# Patient Record
Sex: Male | Born: 1995 | Race: White | Hispanic: No | Marital: Single | Smoking: Never smoker
Health system: Southern US, Community
[De-identification: ages and names within clinical notes are randomized; demographics above are authoritative.]

## PROBLEM LIST (undated history)

## (undated) DIAGNOSIS — T7840XA Allergy, unspecified, initial encounter: Secondary | ICD-10-CM

## (undated) DIAGNOSIS — M1990 Unspecified osteoarthritis, unspecified site: Secondary | ICD-10-CM

## (undated) HISTORY — DX: Allergy, unspecified, initial encounter: T78.40XA

## (undated) HISTORY — DX: Unspecified osteoarthritis, unspecified site: M19.90

## (undated) HISTORY — PX: APPENDECTOMY: SHX54

---
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
## 2014-09-21 ENCOUNTER — Ambulatory Visit (INDEPENDENT_AMBULATORY_CARE_PROVIDER_SITE_OTHER): Payer: BC Managed Care – PPO | Admitting: Family Medicine

## 2014-09-21 ENCOUNTER — Encounter: Payer: Self-pay | Admitting: Family Medicine

## 2014-09-21 VITALS — BP 100/70 | HR 53 | Temp 98.5°F | Resp 16 | Ht 74.0 in | Wt 171.2 lb

## 2014-09-21 DIAGNOSIS — Z0184 Encounter for antibody response examination: Secondary | ICD-10-CM

## 2014-09-21 DIAGNOSIS — Z0000 Encounter for general adult medical examination without abnormal findings: Secondary | ICD-10-CM

## 2014-09-21 DIAGNOSIS — J302 Other seasonal allergic rhinitis: Secondary | ICD-10-CM | POA: Diagnosis not present

## 2014-09-21 DIAGNOSIS — Z23 Encounter for immunization: Secondary | ICD-10-CM | POA: Diagnosis not present

## 2014-09-21 LAB — COMPLETE METABOLIC PANEL WITH GFR
ALK PHOS: 66 U/L (ref 39–117)
ALT: 16 U/L (ref 0–53)
AST: 22 U/L (ref 0–37)
Albumin: 4 g/dL (ref 3.5–5.2)
BILIRUBIN TOTAL: 0.6 mg/dL (ref 0.2–1.2)
BUN: 11 mg/dL (ref 6–23)
CO2: 28 mEq/L (ref 19–32)
Calcium: 9.1 mg/dL (ref 8.4–10.5)
Chloride: 106 mEq/L (ref 96–112)
Creat: 0.98 mg/dL (ref 0.50–1.35)
GFR, Est African American: >89 mL/min
GLUCOSE: 85 mg/dL (ref 70–99)
Potassium: 4.3 mEq/L (ref 3.5–5.3)
Sodium: 140 mEq/L (ref 135–145)
TOTAL PROTEIN: 6.2 g/dL (ref 6.0–8.3)

## 2014-09-21 LAB — CBC WITH DIFFERENTIAL/PLATELET
Basophils Absolute: 0 10*3/uL (ref 0.0–0.1)
Basophils Relative: 0 % (ref 0–1)
EOS ABS: 0.1 10*3/uL (ref 0.0–0.7)
EOS PCT: 1 % (ref 0–5)
HCT: 43.4 % (ref 39.0–52.0)
Hemoglobin: 14.8 g/dL (ref 13.0–17.0)
LYMPHS ABS: 1.6 10*3/uL (ref 0.7–4.0)
Lymphocytes Relative: 26 % (ref 12–46)
MCH: 30.8 pg (ref 26.0–34.0)
MCHC: 34.1 g/dL (ref 30.0–36.0)
MCV: 90.2 fL (ref 78.0–100.0)
MPV: 10.7 fL (ref 8.6–12.4)
Monocytes Absolute: 0.5 10*3/uL (ref 0.1–1.0)
Monocytes Relative: 9 % (ref 3–12)
Neutro Abs: 3.8 10*3/uL (ref 1.7–7.7)
Neutrophils Relative %: 64 % (ref 43–77)
PLATELETS: 252 10*3/uL (ref 150–400)
RBC: 4.81 MIL/uL (ref 4.22–5.81)
RDW: 12.9 % (ref 11.5–15.5)
WBC: 6 10*3/uL (ref 4.0–10.5)

## 2014-09-21 LAB — POCT URINALYSIS DIPSTICK
Bilirubin, UA: NEGATIVE
Glucose, UA: NEGATIVE
Ketones, UA: NEGATIVE
Leukocytes, UA: NEGATIVE
NITRITE UA: NEGATIVE
PROTEIN UA: NEGATIVE
RBC UA: NEGATIVE
SPEC GRAV UA: 1.01
UROBILINOGEN UA: 0.2
pH, UA: 6.5

## 2014-09-21 MED ORDER — FLUTICASONE PROPIONATE 50 MCG/ACT NA SUSP: NASAL | Status: DC

## 2014-09-21 NOTE — Patient Instructions (Addendum)
Keeping you healthy  Get these tests  Blood pressure- Have your blood pressure checked once a year by your healthcare provider.  Normal blood pressure is 120/80.  Weight- Have your body mass index (BMI) calculated to screen for obesity.  BMI is a measure of body fat based on height and weight. You can also calculate your own BMI at https://www.west-esparza.com/www.nhlbisupport.com/bmi/.  Cholesterol- Have your cholesterol checked regularly starting at age 20, sooner may be necessary if you have diabetes, high blood pressure, if a family member developed heart diseases at an early age or if you smoke.   Chlamydia, HIV, and other sexual transmitted disease- Get screened each year until the age of 20 then within three months of each new sexual partner.  Diabetes- Have your blood sugar checked regularly if you have high blood pressure, high cholesterol, a family history of diabetes or if you are overweight.  Get these vaccines- All your immunizations are current.  Flu shot- Every fall.  Tetanus shot- Every 10 years.  Menactra- Single dose; prevents meningitis.  Take these steps  Don't smoke- If you do smoke, ask your healthcare provider about quitting. For tips on how to quit, go to www.smokefree.gov or call 1-800-QUIT-NOW.  Be physically active- Exercise 5 days a week for at least 30 minutes.  If you are not already physically active start slow and gradually work up to 30 minutes of moderate physical activity.  Examples of moderate activity include walking briskly, mowing the yard, dancing, swimming bicycling, etc.  Eat a healthy diet- Eat a variety of healthy foods such as fruits, vegetables, low fat milk, low fat cheese, yogurt, lean meats, poultry, fish, beans, tofu, etc.  For more information on healthy eating, go to www.thenutritionsource.org  Drink alcohol in moderation- Limit alcohol intake two drinks or less a day.  Never drink and drive.  Dentist- Brush and floss teeth twice daily; visit your dentis twice a  year.  Depression-Your emotional health is as important as your physical health.  If you're feeling down, losing interest in things you normally enjoy please talk with your healthcare provider.  Gun Safety- If you keep a gun in your home, keep it unloaded and with the safety lock on.  Bullets should be stored separately.  Helmet use- Always wear a helmet when riding a motorcycle, bicycle, rollerblading or skateboarding.  Safe sex- If you may be exposed to a sexually transmitted infection, use a condom  Seat belts- Seat bels can save your life; always wear one.  Smoke/Carbon Monoxide detectors- These detectors need to be installed on the appropriate level of your home.  Replace batteries at least once a year.  Skin Cancer- When out in the sun, cover up and use sunscreen SPF 15 or higher.  Violence- If anyone is threatening or hurting you, please tell your healthcare provider.   The results of labs will be available in about 5 days. You must return to have your PPD checked in 48-72 hours.

## 2014-09-21 NOTE — Progress Notes (Signed)
Subjective:    Patient ID: Yoshito Gaza, male    DOB: 04/25/1996, 19 y.o.   MRN: [id number]  HPI  This 19 y.o. Male is here for CPE and to have form completed related to employment. 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 He is in [employer], after active [employer] duty.  HCM: IMM- Current.   Review of Systems  Constitutional: Negative.   HENT: Negative.   Eyes: Negative.   Respiratory: Negative.   Cardiovascular: Negative.   Gastrointestinal: Negative.   Endocrine: Negative.   Genitourinary: Negative.        Not sexually active- low risk for STDs per pt.  Musculoskeletal: Negative.   Skin: Negative.   Neurological: Negative.   Hematological: Negative.   Psychiatric/Behavioral: Negative.       Objective:   Physical Exam  Constitutional: He is oriented to person, place, and time. Vital signs are normal. He appears well-developed and well-nourished. No distress.  Blood pressure 100/70, pulse 53, temperature 98.5 F (36.9 C), temperature source Oral, resp. rate 16, height  (1.88 m), weight 171 lb 3.2 oz (77.656 kg), SpO2 98 %.   HENT:  Head: Normocephalic and atraumatic.  Right Ear: Hearing, tympanic membrane, external ear and ear canal normal.  Left Ear: Hearing, tympanic membrane, external ear and ear canal normal.  Nose: Nose normal. No nasal deformity or septal deviation.  Mouth/Throat: Uvula is midline, oropharynx is clear and moist and mucous membranes are normal. No oral lesions. Normal dentition. No dental caries.  Eyes: Conjunctivae, EOM and lids are normal. Pupils are equal, round, and reactive to light. No scleral icterus.  Fundoscopic exam:      The right eye shows no arteriolar narrowing, no AV nicking and no papilledema. The right eye shows red reflex.       The left eye shows no arteriolar narrowing, no AV nicking and no papilledema. The left eye shows red reflex.  Neck: Trachea normal, normal range of motion and full passive range of motion  without pain. Neck supple. No spinous process tenderness and no muscular tenderness present. No thyroid mass and no thyromegaly present.  Cardiovascular: Normal rate, regular rhythm, S1 normal, S2 normal, normal heart sounds, intact distal pulses and normal pulses.   No extrasystoles are present. PMI is not displaced.  Exam reveals no gallop and no friction rub.   No murmur heard. Pulmonary/Chest: Effort normal and breath sounds normal. No respiratory distress.  Abdominal: Soft. Normal appearance and bowel sounds are normal. He exhibits no distension and no mass. There is no hepatosplenomegaly. There is no tenderness. There is no guarding and no CVA tenderness.  Genitourinary:  Deferred.  Musculoskeletal:       Right shoulder: Normal.       Right elbow: Normal.      Left elbow: Normal.       Right knee: Normal.       Left knee: Normal.       Cervical back: Normal.       Thoracic back: Normal.       Lumbar back: Normal.  Remainder of exam unremarkable.  Lymphadenopathy:       Head (right side): No submental, no submandibular, no tonsillar, no preauricular, no posterior auricular and no occipital adenopathy present.       Head (left side): No submental, no submandibular, no tonsillar, no preauricular, no posterior auricular and no occipital adenopathy present.    He has no cervical adenopathy.  Right: No inguinal and no supraclavicular adenopathy present.       Left: No inguinal and no supraclavicular adenopathy present.  Neurological: He is alert and oriented to person, place, and time. He has normal strength and normal reflexes. No cranial nerve deficit or sensory deficit. He displays a negative Romberg sign. Coordination and gait normal.  Skin: Skin is warm, dry and intact. No ecchymosis, no lesion and no rash noted. He is not diaphoretic. No cyanosis or erythema. No pallor. Nails show no clubbing.  Psychiatric: He has a normal mood and affect. His speech is normal. Judgment and  thought content normal. Cognition and memory are normal.  Pt visibly nervous initially but relaxed by end of encounter.  Nursing note and vitals reviewed.   Results for orders placed or performed in visit on 09/21/14  POCT urinalysis dipstick  Result Value Ref Range   Color, UA yellow    Clarity, UA clear    Glucose, UA neg    Bilirubin, UA neg    Ketones, UA neg    Spec Grav, UA 1.010    Blood, UA neg    pH, UA 6.5    Protein, UA neg    Urobilinogen, UA 0.2    Nitrite, UA neg    Leukocytes, UA Negative        Assessment & Plan:  Routine general medical examination at a health care facility - Plan: POCT urinalysis dipstick, CBC with Differential/Platelet, COMPLETE METABOLIC PANEL WITH GFR  Other seasonal allergic rhinitis - Plan: fluticasone (FLONASE) 50 MCG/ACT nasal spray  Immunity status testing - Plan: Measles/Mumps/Rubella Immunity, Varicella zoster antibody, IgG  Need for tuberculosis vaccination - Plan: TB Skin Test

## 2014-09-22 LAB — MEASLES/MUMPS/RUBELLA IMMUNITY
Mumps IgG: 77.6 AU/mL — ABNORMAL HIGH (ref <9.00)
RUBELLA: 14.2 {index} — AB (ref <0.90)
RUBEOLA IGG: 79.6 [AU]/ml — AB (ref <25.00)

## 2014-09-22 LAB — VARICELLA ZOSTER ANTIBODY, IGG: VARICELLA IGG: 109.6 {index} (ref <135.00)

## 2014-09-23 ENCOUNTER — Ambulatory Visit (INDEPENDENT_AMBULATORY_CARE_PROVIDER_SITE_OTHER): Payer: BC Managed Care – PPO | Admitting: *Deleted

## 2014-09-23 ENCOUNTER — Encounter: Payer: Self-pay | Admitting: *Deleted

## 2014-09-23 DIAGNOSIS — Z111 Encounter for screening for respiratory tuberculosis: Secondary | ICD-10-CM

## 2014-09-23 LAB — TB SKIN TEST
Induration: 0 mm
TB Skin Test: NEGATIVE

## 2014-09-23 NOTE — Progress Notes (Signed)
Quick Note:  Please advise pt regarding following labs... Complete blood counts (CBC) and metabolic panel (sodium, potassium, calcium, blood sugar, kidney and liver function) are normal.  Recent labs show that you have had MMR vaccine and have immunity. The Varicella titer (chicken pox) is low and indicates no immunity. You need to have 2 doses of the Varicella vaccine. This vaccine may be available at the local health department.  Copy of results to pt. ______

## 2014-09-24 ENCOUNTER — Encounter: Payer: Self-pay | Admitting: Family Medicine

## 2014-09-24 NOTE — Progress Notes (Signed)
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
## 2015-07-18 ENCOUNTER — Ambulatory Visit (INDEPENDENT_AMBULATORY_CARE_PROVIDER_SITE_OTHER): Payer: BC Managed Care – PPO | Admitting: Emergency Medicine

## 2015-07-18 VITALS — BP 108/72 | HR 67 | Temp 98.2°F | Resp 16 | Ht 73.5 in | Wt 165.2 lb

## 2015-07-18 DIAGNOSIS — J0140 Acute pansinusitis, unspecified: Secondary | ICD-10-CM | POA: Diagnosis not present

## 2015-07-18 DIAGNOSIS — J209 Acute bronchitis, unspecified: Secondary | ICD-10-CM | POA: Diagnosis not present

## 2015-07-18 MED ORDER — AMOXICILLIN-POT CLAVULANATE 875-125 MG PO TABS: 1.0000 | ORAL_TABLET | Freq: Two times a day (BID) | ORAL | Status: DC

## 2015-07-18 MED ORDER — PSEUDOEPHEDRINE-GUAIFENESIN ER 60-600 MG PO TB12: 1.0000 | ORAL_TABLET | Freq: Two times a day (BID) | ORAL | Status: DC

## 2015-07-18 NOTE — Progress Notes (Deleted)
[facility] [address], [place] [address] [phone]  Date:  07/18/2015   Name:  Donald ReaperJoshua Forbes   DOB:  1995-09-08   MRN:  [id number]  PCP:  No PCP Per Patient    Chief Complaint: Sinusitis and Nasal Congestion   History of Present Illness:  This is a 20 y.o. male with PMH allergic rhinitis who is presenting with nasal congestion and postnasal drip since waking this morning.  Cough: *** SOB/wheezing: *** Nasal congestion: *** Otalgia: *** Sore throat: *** Fever/chills: *** Aggravating/alleviating factors: *** History of asthma: *** History of env allergies: *** Tobacco use: ***   Review of Systems:  Review of Systems  Patient Active Problem List   Diagnosis Date Noted  . Other seasonal allergic rhinitis 09/21/2014    Prior to Admission medications   Medication Sig Start Date End Date Taking? Authorizing Provider  fluticasone (FLONASE) 50 MCG/ACT nasal spray 2 sprays into each nostril daily for allergies 09/21/14  Yes Maurice MarchBarbara B McPherson, MD    Allergies  Allergen Reactions  . Benadryl [Diphenhydramine]   . Codeine Other (See Comments)    Suspect GI intolerance but exact problem not known.    Past Surgical History  Procedure Laterality Date  . Appendectomy      Social History  Substance Use Topics  . Smoking status: Never Smoker   . Smokeless tobacco: None  . Alcohol Use: No    Family History  Problem Relation Age of Onset  . Arthritis Mother   . Amenorrhea Maternal Aunt   . Arthritis Maternal Grandmother   . Stroke Maternal Grandmother   . Arthritis Maternal Grandfather   . Stroke Maternal Grandfather   . Hyperlipidemia Maternal Grandfather   . Arthritis Paternal Grandmother   . Arthritis Paternal Grandfather   . Diabetes Paternal Grandfather   . AAA (abdominal aortic aneurysm) Neg Hx     Medication list has been reviewed and updated.  Physical Examination:  Physical Exam  BP 108/72 mmHg  Pulse 67  Temp(Src)  98.2 F (36.8 C) (Oral)  Resp 16  Ht 6' 1.5" (1.867 m)  Wt 165 lb 3.2 oz (74.934 kg)  BMI 21.50 kg/m2  SpO2 98%  Assessment and Plan:

## 2015-07-18 NOTE — Patient Instructions (Signed)

## 2015-07-18 NOTE — Progress Notes (Signed)
Subjective:  Patient ID: Donald Forbes, male    DOB: 10-31-1995  Age: 20 y.o. MRN: [id number]  CC: Sinusitis and Nasal Congestion   HPI Coady Train presents  patient has nasal congestion nasal discharge and postnasal drainage is purulent character she has a cough no wheezing or shortness of breath. He has no nausea vomiting or stool change. No rash. Been unable to control her symptoms with over-the-counter medication. He has a history of seasonal allergic rhinitis  History Emilio has a past medical history of Allergy and Arthritis.   He has past surgical history that includes Appendectomy.   His  family history includes Amenorrhea in his maternal aunt; Arthritis in his maternal grandfather, maternal grandmother, mother, paternal grandfather, and paternal grandmother; Diabetes in his paternal grandfather; Hyperlipidemia in his maternal grandfather; Stroke in his maternal grandfather and maternal grandmother. There is no history of AAA (abdominal aortic aneurysm).  He   reports that he has never smoked. He does not have any smokeless tobacco history on file. He reports that he does not drink alcohol or use illicit drugs.  Outpatient Prescriptions Prior to Visit  Medication Sig Dispense Refill  . fluticasone (FLONASE) 50 MCG/ACT nasal spray 2 sprays into each nostril daily for allergies 16 g 6   No facility-administered medications prior to visit.    Social History   Social History  . Marital Status: Single    Spouse Name: N/A  . Number of Children: N/A  . Years of Education: N/A   Occupational History  . [employer]    Social History Main Topics  . Smoking status: Never Smoker   . Smokeless tobacco: None  . Alcohol Use: No  . Drug Use: No  . Sexual Activity: No   Other Topics Concern  . None   Social History Narrative   Education: senior in high school. Single. Exercise: Yes.   Tobacco: none     Review of Systems  Constitutional: Positive for  fatigue. Negative for fever, chills and appetite change.  HENT: Positive for congestion, rhinorrhea and sinus pressure. Negative for ear pain, postnasal drip and sore throat.   Eyes: Negative for pain and redness.  Respiratory: Positive for cough. Negative for shortness of breath and wheezing.   Cardiovascular: Negative for leg swelling.  Gastrointestinal: Negative for nausea, vomiting, abdominal pain, diarrhea, constipation and blood in stool.  Endocrine: Negative for polyuria.  Genitourinary: Negative for dysuria, urgency, frequency and flank pain.  Musculoskeletal: Negative for gait problem.  Skin: Negative for rash.  Neurological: Negative for weakness and headaches.  Psychiatric/Behavioral: Negative for confusion and decreased concentration. The patient is not nervous/anxious.     Objective:  BP 108/72 mmHg  Pulse 67  Temp(Src) 98.2 F (36.8 C) (Oral)  Resp 16  Ht 6' 1.5" (1.867 m)  Wt 165 lb 3.2 oz (74.934 kg)  BMI 21.50 kg/m2  SpO2 98%  Physical Exam  Constitutional: He is oriented to person, place, and time. He appears well-developed and well-nourished. No distress.  HENT:  Head: Normocephalic and atraumatic.  Right Ear: External ear normal.  Left Ear: External ear normal.  Nose: Nose normal.  Eyes: Conjunctivae and EOM are normal. Pupils are equal, round, and reactive to light. No scleral icterus.  Neck: Normal range of motion. Neck supple. No tracheal deviation present.  Cardiovascular: Normal rate, regular rhythm and normal heart sounds.   Pulmonary/Chest: Effort normal. No respiratory distress. He has no wheezes. He has no rales.  Abdominal: He exhibits no  mass. There is no tenderness. There is no rebound and no guarding.  Musculoskeletal: He exhibits no edema.  Lymphadenopathy:    He has no cervical adenopathy.  Neurological: He is alert and oriented to person, place, and time. Coordination normal.  Skin: Skin is warm and dry. No rash noted.  Psychiatric: He has  a normal mood and affect. His behavior is normal.      Assessment & Plan:   [name] was seen today for sinusitis and nasal congestion.  Diagnoses and all orders for this visit:  Acute bronchitis, unspecified organism  Acute pansinusitis, recurrence not specified  Other orders -     amoxicillin-clavulanate (AUGMENTIN) 875-125 MG tablet; Take 1 tablet by mouth 2 (two) times daily. -     pseudoephedrine-guaifenesin (MUCINEX D) 60-600 MG 12 hr tablet; Take 1 tablet by mouth every 12 (twelve) hours.  I am having Mr. [name] start on amoxicillin-clavulanate and pseudoephedrine-guaifenesin. I am also having him maintain his fluticasone.  Meds ordered this encounter  Medications  . amoxicillin-clavulanate (AUGMENTIN) 875-125 MG tablet    Sig: Take 1 tablet by mouth 2 (two) times daily.    Dispense:  20 tablet    Refill:  0  . pseudoephedrine-guaifenesin (MUCINEX D) 60-600 MG 12 hr tablet    Sig: Take 1 tablet by mouth every 12 (twelve) hours.    Dispense:  18 tablet    Refill:  0    Appropriate red flag conditions were discussed with the patient as well as actions that should be taken.  Patient expressed his understanding.  Follow-up: Return if symptoms worsen or fail to improve.  Carmelina DaneAnderson, Jeffery S, MD

## 2016-01-25 ENCOUNTER — Ambulatory Visit (INDEPENDENT_AMBULATORY_CARE_PROVIDER_SITE_OTHER): Payer: BC Managed Care – PPO | Admitting: Urgent Care

## 2016-01-25 VITALS — BP 118/74 | HR 68 | Temp 98.6°F | Resp 18 | Ht 73.5 in | Wt 175.2 lb

## 2016-01-25 DIAGNOSIS — L259 Unspecified contact dermatitis, unspecified cause: Secondary | ICD-10-CM

## 2016-01-25 DIAGNOSIS — W57XXXA Bitten or stung by nonvenomous insect and other nonvenomous arthropods, initial encounter: Secondary | ICD-10-CM | POA: Diagnosis not present

## 2016-01-25 DIAGNOSIS — S30860A Insect bite (nonvenomous) of lower back and pelvis, initial encounter: Secondary | ICD-10-CM | POA: Diagnosis not present

## 2016-01-25 NOTE — Patient Instructions (Addendum)
Tick Bite Information Ticks are insects that attach themselves to the skin and draw blood for food. There are various types of ticks. Common types include wood ticks and deer ticks. Most ticks live in shrubs and grassy areas. Ticks can climb onto your body when you make contact with leaves or grass where the tick is waiting. The most common places on the body for ticks to attach themselves are the scalp, neck, armpits, waist, and groin. Most tick bites are harmless, but sometimes ticks carry germs that cause diseases. These germs can be spread to a person during the tick's feeding process. The chance of a disease spreading through a tick bite depends on:   The type of tick.  Time of year.   How long the tick is attached.   Geographic location.  HOW CAN YOU PREVENT TICK BITES? Take these steps to help prevent tick bites when you are outdoors:  Wear protective clothing. Long sleeves and long pants are best.   Wear white clothes so you can see ticks more easily.  Tuck your pant legs into your socks.   If walking on a trail, stay in the middle of the trail to avoid brushing against bushes.  Avoid walking through areas with long grass.  Put insect repellent on all exposed skin and along boot tops, pant legs, and sleeve cuffs.   Check clothing, hair, and skin repeatedly and before going inside.   Brush off any ticks that are not attached.  Take a shower or bath as soon as possible after being outdoors.  WHAT IS THE PROPER WAY TO REMOVE A TICK? Ticks should be removed as soon as possible to help prevent diseases caused by tick bites. 1. If latex gloves are available, put them on before trying to remove a tick.  2. Using fine-point tweezers, grasp the tick as close to the skin as possible. You may also use curved forceps or a tick removal tool. Grasp the tick as close to its head as possible. Avoid grasping the tick on its body. 3. Pull gently with steady upward pressure until  the tick lets go. Do not twist the tick or jerk it suddenly. This may break off the tick's head or mouth parts. 4. Do not squeeze or crush the tick's body. This could force disease-carrying fluids from the tick into your body.  5. After the tick is removed, wash the bite area and your hands with soap and water or other disinfectant such as alcohol. 6. Apply a small amount of antiseptic cream or ointment to the bite site.  7. Wash and disinfect any instruments that were used.  Do not try to remove a tick by applying a hot match, petroleum jelly, or fingernail polish to the tick. These methods do not work and may increase the chances of disease being spread from the tick bite.  WHEN SHOULD YOU SEEK MEDICAL CARE? Contact your health care provider if you are unable to remove a tick from your skin or if a part of the tick breaks off and is stuck in the skin.  After a tick bite, you need to be aware of signs and symptoms that could be related to diseases spread by ticks. Contact your health care provider if you develop any of the following in the days or weeks after the tick bite:  Unexplained fever.  Rash. A circular rash that appears days or weeks after the tick bite may indicate the possibility of Lyme disease. The rash may resemble   a target with a bull's-eye and may occur at a different part of your body than the tick bite.  Redness and swelling in the area of the tick bite.   Tender, swollen lymph glands.   Diarrhea.   Weight loss.   Cough.   Fatigue.   Muscle, joint, or bone pain.   Abdominal pain.   Headache.   Lethargy or a change in your level of consciousness.  Difficulty walking or moving your legs.   Numbness in the legs.   Paralysis.  Shortness of breath.   Confusion.   Repeated vomiting.    This information is not intended to replace advice given to you by your health care provider. Make sure you discuss any questions you have with your health  care provider.   Document Released: 06/22/2000 Document Revised: 07/16/2014 Document Reviewed: 12/03/2012 Elsevier Interactive Patient Education 2016 Elsevier Inc.     IF you received an x-ray today, you will receive an invoice from [facility]. Please contact [facility] at [phone] with questions or concerns regarding your invoice.   IF you received labwork today, you will receive an invoice from [facility]. Please contact [facility] at [phone] with questions or concerns regarding your invoice.   Our billing staff will not be able to assist you with questions regarding bills from these companies.  You will be contacted with the lab results as soon as they are available. The fastest way to get your results is to activate your My Chart account. Instructions are located on the last page of this paperwork. If you have not heard from us regarding the results in 2 weeks, please contact this office.      

## 2016-01-25 NOTE — Progress Notes (Signed)
    MRN: [id number] DOB: [birth year]  Subjective:   Donald Forbes is a 20 y.o. male presenting for chief complaint of Tick Removal  Reports that he had a tick bite over night. He had the tick partially removed this morning and is worried that the head is still there. Denies fever, rashes, headache, cough, n/v, abdominal pain. Has not taken any medications.  Donald Forbes has a current medication list which includes the following prescription(s): fluticasone. Also is allergic to benadryl and codeine.  Donald Forbes  has a past medical history of Allergy and Arthritis. Also  has past surgical history that includes Appendectomy.  Objective:   Vitals: BP 118/74 mmHg  Pulse 68  Temp(Src) 98.6 F (37 C) (Oral)  Resp 18  Ht 6' 1.5" (1.867 m)  Wt 175 lb 3.2 oz (79.47 kg)  BMI 22.80 kg/m2  SpO2 99%  Physical Exam  Constitutional: He is oriented to person, place, and time. He appears well-developed and well-nourished.  HENT:  Mouth/Throat: Oropharynx is clear and moist.  Eyes: Pupils are equal, round, and reactive to light.  Cardiovascular: Normal rate, regular rhythm and intact distal pulses.  Exam reveals no gallop and no friction rub.   No murmur heard. Pulmonary/Chest: No respiratory distress. He has no wheezes. He has no rales.  Neurological: He is alert and oriented to person, place, and time.  Skin: Skin is warm and dry.         Assessment and Plan :   1. Tick bite of back, initial encounter 2. Contact dermatitis - Patient is to start Zyrtec for contact dermatitis. Counseled on signs of tick borne illness. RTC if this develops.  Wallis BambergMario Shamarr Faucett, PA-C [facility] [phone] 01/25/2016 4:38 PM

## 2016-02-22 ENCOUNTER — Ambulatory Visit (INDEPENDENT_AMBULATORY_CARE_PROVIDER_SITE_OTHER): Payer: BC Managed Care – PPO

## 2016-02-22 ENCOUNTER — Ambulatory Visit (INDEPENDENT_AMBULATORY_CARE_PROVIDER_SITE_OTHER): Payer: BC Managed Care – PPO | Admitting: Family Medicine

## 2016-02-22 ENCOUNTER — Encounter: Payer: Self-pay | Admitting: Family Medicine

## 2016-02-22 VITALS — BP 114/80 | HR 71 | Temp 98.2°F | Resp 16 | Ht 73.5 in | Wt 179.8 lb

## 2016-02-22 DIAGNOSIS — M542 Cervicalgia: Secondary | ICD-10-CM

## 2016-02-22 DIAGNOSIS — M25532 Pain in left wrist: Secondary | ICD-10-CM

## 2016-02-22 DIAGNOSIS — S161XXA Strain of muscle, fascia and tendon at neck level, initial encounter: Secondary | ICD-10-CM

## 2016-02-22 DIAGNOSIS — S7002XA Contusion of left hip, initial encounter: Secondary | ICD-10-CM

## 2016-02-22 DIAGNOSIS — M25552 Pain in left hip: Secondary | ICD-10-CM

## 2016-02-22 DIAGNOSIS — S60212A Contusion of left wrist, initial encounter: Secondary | ICD-10-CM

## 2016-02-22 MED ORDER — TIZANIDINE HCL 4 MG PO CAPS: 4.0000 mg | ORAL_CAPSULE | Freq: Three times a day (TID) | ORAL | 0 refills | Status: DC | PRN

## 2016-02-22 MED ORDER — IBUPROFEN 800 MG PO TABS: 800.0000 mg | ORAL_TABLET | Freq: Three times a day (TID) | ORAL | 0 refills | Status: DC | PRN

## 2016-02-22 NOTE — Progress Notes (Addendum)
error 
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
## 2016-02-22 NOTE — Patient Instructions (Addendum)
   IF you received an x-ray today, you will receive an invoice from [facility]. Please contact [facility] at [phone] with questions or concerns regarding your invoice.   IF you received labwork today, you will receive an invoice from [facility]. Please contact [facility] at [phone] with questions or concerns regarding your invoice.   Our billing staff will not be able to assist you with questions regarding bills from these companies.  You will be contacted with the lab results as soon as they are available. The fastest way to get your results is to activate your My Chart account. Instructions are located on the last page of this paperwork. If you have not heard from us regarding the results in 2 weeks, please contact this office.     Cervical Strain and Sprain With Rehab Cervical strain and sprain are injuries that commonly occur with "whiplash" injuries. Whiplash occurs when the neck is forcefully whipped backward or forward, such as during a motor vehicle accident or during contact sports. The muscles, ligaments, tendons, discs, and nerves of the neck are susceptible to injury when this occurs. RISK FACTORS Risk of having a whiplash injury increases if:  Osteoarthritis of the spine.  Situations that make head or neck accidents or trauma more likely.  High-risk sports (football, rugby, wrestling, hockey, auto racing, gymnastics, diving, contact karate, or boxing).  Poor strength and flexibility of the neck.  Previous neck injury.  Poor tackling technique.  Improperly fitted or padded equipment. SYMPTOMS   Pain or stiffness in the front or back of neck or both.  Symptoms may present immediately or up to 24 hours after injury.  Dizziness, headache, nausea, and vomiting.  Muscle spasm with soreness and stiffness in the neck.  Tenderness and swelling at the injury site. PREVENTION  Learn and use proper technique (avoid  tackling with the head, spearing, and head-butting; use proper falling techniques to avoid landing on the head).  Warm up and stretch properly before activity.  Maintain physical fitness:  Strength, flexibility, and endurance.  Cardiovascular fitness.  Wear properly fitted and padded protective equipment, such as padded soft collars, for participation in contact sports. PROGNOSIS  Recovery from cervical strain and sprain injuries is dependent on the extent of the injury. These injuries are usually curable in 1 week to 3 months with appropriate treatment.  RELATED COMPLICATIONS   Temporary numbness and weakness may occur if the nerve roots are damaged, and this may persist until the nerve has completely healed.  Chronic pain due to frequent recurrence of symptoms.  Prolonged healing, especially if activity is resumed too soon (before complete recovery). TREATMENT  Treatment initially involves the use of ice and medication to help reduce pain and inflammation. It is also important to perform strengthening and stretching exercises and modify activities that worsen symptoms so the injury does not get worse. These exercises may be performed at home or with a therapist. For patients who experience severe symptoms, a soft, padded collar may be recommended to be worn around the neck.  Improving your posture may help reduce symptoms. Posture improvement includes pulling your chin and abdomen in while sitting or standing. If you are sitting, sit in a firm chair with your buttocks against the back of the chair. While sleeping, try replacing your pillow with a small towel rolled to 2 inches in diameter, or use a cervical pillow or soft cervical collar. Poor sleeping positions delay healing.  For patients with nerve root damage,   which causes numbness or weakness, the use of a cervical traction apparatus may be recommended. Surgery is rarely necessary for these injuries. However, cervical strain and sprains  that are present at birth (congenital) may require surgery. MEDICATION   If pain medication is necessary, nonsteroidal anti-inflammatory medications, such as aspirin and ibuprofen, or other minor pain relievers, such as acetaminophen, are often recommended.  Do not take pain medication for 7 days before surgery.  Prescription pain relievers may be given if deemed necessary by your caregiver. Use only as directed and only as much as you need. HEAT AND COLD:   Cold treatment (icing) relieves pain and reduces inflammation. Cold treatment should be applied for 10 to 15 minutes every 2 to 3 hours for inflammation and pain and immediately after any activity that aggravates your symptoms. Use ice packs or an ice massage.  Heat treatment may be used prior to performing the stretching and strengthening activities prescribed by your caregiver, physical therapist, or athletic trainer. Use a heat pack or a warm soak. SEEK MEDICAL CARE IF:   Symptoms get worse or do not improve in 2 weeks despite treatment.  New, unexplained symptoms develop (drugs used in treatment may produce side effects). EXERCISES RANGE OF MOTION (ROM) AND STRETCHING EXERCISES - Cervical Strain and Sprain These exercises may help you when beginning to rehabilitate your injury. In order to successfully resolve your symptoms, you must improve your posture. These exercises are designed to help reduce the forward-head and rounded-shoulder posture which contributes to this condition. Your symptoms may resolve with or without further involvement from your physician, physical therapist or athletic trainer. While completing these exercises, remember:   Restoring tissue flexibility helps normal motion to return to the joints. This allows healthier, less painful movement and activity.  An effective stretch should be held for at least 20 seconds, although you may need to begin with shorter hold times for comfort.  A stretch should never be  painful. You should only feel a gentle lengthening or release in the stretched tissue. STRETCH- Axial Extensors  Lie on your back on the floor. You may bend your knees for comfort. Place a rolled-up hand towel or dish towel, about 2 inches in diameter, under the part of your head that makes contact with the floor.  Gently tuck your chin, as if trying to make a "double chin," until you feel a gentle stretch at the base of your head.  Hold __________ seconds. Repeat __________ times. Complete this exercise __________ times per day.  STRETCH - Axial Extension   Stand or sit on a firm surface. Assume a good posture: chest up, shoulders drawn back, abdominal muscles slightly tense, knees unlocked (if standing) and feet hip width apart.  Slowly retract your chin so your head slides back and your chin slightly lowers. Continue to look straight ahead.  You should feel a gentle stretch in the back of your head. Be certain not to feel an aggressive stretch since this can cause headaches later.  Hold for __________ seconds. Repeat __________ times. Complete this exercise __________ times per day. STRETCH - Cervical Side Bend   Stand or sit on a firm surface. Assume a good posture: chest up, shoulders drawn back, abdominal muscles slightly tense, knees unlocked (if standing) and feet hip width apart.  Without letting your nose or shoulders move, slowly tip your right / left ear to your shoulder until your feel a gentle stretch in the muscles on the opposite side of your neck.    Hold __________ seconds. Repeat __________ times. Complete this exercise __________ times per day. STRETCH - Cervical Rotators   Stand or sit on a firm surface. Assume a good posture: chest up, shoulders drawn back, abdominal muscles slightly tense, knees unlocked (if standing) and feet hip width apart.  Keeping your eyes level with the ground, slowly turn your head until you feel a gentle stretch along the back and opposite  side of your neck.  Hold __________ seconds. Repeat __________ times. Complete this exercise __________ times per day. RANGE OF MOTION - Neck Circles   Stand or sit on a firm surface. Assume a good posture: chest up, shoulders drawn back, abdominal muscles slightly tense, knees unlocked (if standing) and feet hip width apart.  Gently roll your head down and around from the back of one shoulder to the back of the other. The motion should never be forced or painful.  Repeat the motion 10-20 times, or until you feel the neck muscles relax and loosen. Repeat __________ times. Complete the exercise __________ times per day. STRENGTHENING EXERCISES - Cervical Strain and Sprain These exercises may help you when beginning to rehabilitate your injury. They may resolve your symptoms with or without further involvement from your physician, physical therapist, or athletic trainer. While completing these exercises, remember:   Muscles can gain both the endurance and the strength needed for everyday activities through controlled exercises.  Complete these exercises as instructed by your physician, physical therapist, or athletic trainer. Progress the resistance and repetitions only as guided.  You may experience muscle soreness or fatigue, but the pain or discomfort you are trying to eliminate should never worsen during these exercises. If this pain does worsen, stop and make certain you are following the directions exactly. If the pain is still present after adjustments, discontinue the exercise until you can discuss the trouble with your clinician. STRENGTH - Cervical Flexors, Isometric  Face a wall, standing about 6 inches away. Place a small pillow, a ball about 6-8 inches in diameter, or a folded towel between your forehead and the wall.  Slightly tuck your chin and gently push your forehead into the soft object. Push only with mild to moderate intensity, building up tension gradually. Keep your jaw  and forehead relaxed.  Hold 10 to 20 seconds. Keep your breathing relaxed.  Release the tension slowly. Relax your neck muscles completely before you start the next repetition. Repeat __________ times. Complete this exercise __________ times per day. STRENGTH- Cervical Lateral Flexors, Isometric   Stand about 6 inches away from a wall. Place a small pillow, a ball about 6-8 inches in diameter, or a folded towel between the side of your head and the wall.  Slightly tuck your chin and gently tilt your head into the soft object. Push only with mild to moderate intensity, building up tension gradually. Keep your jaw and forehead relaxed.  Hold 10 to 20 seconds. Keep your breathing relaxed.  Release the tension slowly. Relax your neck muscles completely before you start the next repetition. Repeat __________ times. Complete this exercise __________ times per day. STRENGTH - Cervical Extensors, Isometric   Stand about 6 inches away from a wall. Place a small pillow, a ball about 6-8 inches in diameter, or a folded towel between the back of your head and the wall.  Slightly tuck your chin and gently tilt your head back into the soft object. Push only with mild to moderate intensity, building up tension gradually. Keep your jaw and forehead   relaxed.  Hold 10 to 20 seconds. Keep your breathing relaxed.  Release the tension slowly. Relax your neck muscles completely before you start the next repetition. Repeat __________ times. Complete this exercise __________ times per day. POSTURE AND BODY MECHANICS CONSIDERATIONS - Cervical Strain and Sprain Keeping correct posture when sitting, standing or completing your activities will reduce the stress put on different body tissues, allowing injured tissues a chance to heal and limiting painful experiences. The following are general guidelines for improved posture. Your physician or physical therapist will provide you with any instructions specific to your  needs. While reading these guidelines, remember:  The exercises prescribed by your provider will help you have the flexibility and strength to maintain correct postures.  The correct posture provides the optimal environment for your joints to work. All of your joints have less wear and tear when properly supported by a spine with good posture. This means you will experience a healthier, less painful body.  Correct posture must be practiced with all of your activities, especially prolonged sitting and standing. Correct posture is as important when doing repetitive low-stress activities (typing) as it is when doing a single heavy-load activity (lifting). PROLONGED STANDING WHILE SLIGHTLY LEANING FORWARD When completing a task that requires you to lean forward while standing in one place for a long time, place either foot up on a stationary 2- to 4-inch high object to help maintain the best posture. When both feet are on the ground, the low back tends to lose its slight inward curve. If this curve flattens (or becomes too large), then the back and your other joints will experience too much stress, fatigue more quickly, and can cause pain.  RESTING POSITIONS Consider which positions are most painful for you when choosing a resting position. If you have pain with flexion-based activities (sitting, bending, stooping, squatting), choose a position that allows you to rest in a less flexed posture. You would want to avoid curling into a fetal position on your side. If your pain worsens with extension-based activities (prolonged standing, working overhead), avoid resting in an extended position such as sleeping on your stomach. Most people will find more comfort when they rest with their spine in a more neutral position, neither too rounded nor too arched. Lying on a non-sagging bed on your side with a pillow between your knees, or on your back with a pillow under your knees will often provide some relief. Keep in  mind, being in any one position for a prolonged period of time, no matter how correct your posture, can still lead to stiffness. WALKING Walk with an upright posture. Your ears, shoulders, and hips should all line up. OFFICE WORK When working at a desk, create an environment that supports good, upright posture. Without extra support, muscles fatigue and lead to excessive strain on joints and other tissues. CHAIR:  A chair should be able to slide under your desk when your back makes contact with the back of the chair. This allows you to work closely.  The chair's height should allow your eyes to be level with the upper part of your monitor and your hands to be slightly lower than your elbows.  Body position:  Your feet should make contact with the floor. If this is not possible, use a foot rest.  Keep your ears over your shoulders. This will reduce stress on your neck and low back.   This information is not intended to replace advice given to you by your health   care provider. Make sure you discuss any questions you have with your health care provider.   Document Released: 06/25/2005 Document Revised: 07/16/2014 Document Reviewed: 10/07/2008 Elsevier Interactive Patient Education 2016 [employer] Inc[employer]  

## 2016-03-29 ENCOUNTER — Encounter: Payer: Self-pay | Admitting: Family Medicine

## 2016-03-29 ENCOUNTER — Ambulatory Visit (INDEPENDENT_AMBULATORY_CARE_PROVIDER_SITE_OTHER): Payer: BC Managed Care – PPO | Admitting: Family Medicine

## 2016-03-29 VITALS — BP 117/77 | HR 76 | Temp 98.2°F | Resp 16 | Ht 73.5 in | Wt 175.0 lb

## 2016-03-29 DIAGNOSIS — J028 Acute pharyngitis due to other specified organisms: Secondary | ICD-10-CM

## 2016-03-29 DIAGNOSIS — J029 Acute pharyngitis, unspecified: Secondary | ICD-10-CM | POA: Diagnosis not present

## 2016-03-29 DIAGNOSIS — B9789 Other viral agents as the cause of diseases classified elsewhere: Secondary | ICD-10-CM

## 2016-03-29 LAB — POCT RAPID STREP A (OFFICE): RAPID STREP A SCREEN: NEGATIVE

## 2016-03-29 NOTE — Progress Notes (Signed)
   Donald ReaperJoshua Forbes is a 20 y.o. male who presents to [facility] today for sore throat:  1.  Sore throat:  Present for past day or so. "Scratchy" Also with some nasal congestion. Some drainage. Pain worse when he swallows he tries to eat or drink. No actual dysphagia. No fevers or chills. No nausea vomiting. No cough. History of strep throat in the past.  Was around coworker diagnosed with bronchitis and his daughter who was complaining of a sore throat as well.  ROS as above.    PMH reviewed. Patient is a nonsmoker.   Past Medical History:  Diagnosis Date  . Allergy   . Arthritis    Past Surgical History:  Procedure Laterality Date  . APPENDECTOMY      Medications reviewed. Current Outpatient Prescriptions  Medication Sig Dispense Refill  . fluticasone (FLONASE) 50 MCG/ACT nasal spray 2 sprays into each nostril daily for allergies 16 g 6  . tiZANidine (ZANAFLEX) 4 MG capsule Take 1 capsule (4 mg total) by mouth 3 (three) times daily as needed for muscle spasms. (Patient not taking: Reported on 03/29/2016) 20 capsule 0   No current facility-administered medications for this visit.      Physical Exam:  BP 117/77 (BP Location: Right Arm, Patient Position: Sitting, Cuff Size: Large)   Pulse 76   Temp 98.2 F (36.8 C) (Oral)   Resp 16   Ht 6' 1.5" (1.867 m)   Wt 175 lb (79.4 kg)   SpO2 97%   BMI 22.78 kg/m  Gen:  Patient sitting on exam table, appears stated age in no acute distress Head: Normocephalic atraumatic Eyes: EOMI, PERRL, sclera and conjunctiva non-erythematous Ears:  Canals clear bilaterally.  TMs pearly gray bilaterally without erythema or bulging.   Nose:  Nasal turbinates minimally enlarged Mouth: Mucosa membranes moist. Tonsils +2, nonenlarged, but erythematous.  No exudates. Neck: No cervical lymphadenopathy noted Heart:  RRR, no murmurs auscultated. Pulm:  Clear to auscultation bilaterally with good air movement.  No wheezes or rales  noted.     Results for orders placed or performed in visit on 03/29/16  POCT rapid strep A  Result Value Ref Range   Rapid Strep A Screen Negative Negative      Assessment and Plan:  1.  Post-viral sore throat: - secondary to postnasal drip and recent URI. -Symptomatically treatment. -Negative strep here

## 2016-03-29 NOTE — Patient Instructions (Addendum)
Your strep test was indeed negative.    Take some cetirizine (Zyrtec) which can help with the congestion and nasal drainage.  This will help reduce the drainage for your throat.  Your viral cold should run it's course in the next several days or so.  If you start feeling worse, come back and see us.    It was good to meet you today!    IF you received an x-ray today, you will receive an invoice from [facility]. Please contact [facility] at [phone] with questions or concerns regarding your invoice.   IF you received labwork today, you will receive an invoice from [facility]. Please contact [facility] at [phone] with questions or concerns regarding your invoice.   Our billing staff will not be able to assist you with questions regarding bills from these companies.  You will be contacted with the lab results as soon as they are available. The fastest way to get your results is to activate your My Chart account. Instructions are located on the last page of this paperwork. If you have not heard from us regarding the results in 2 weeks, please contact this office.

## 2017-11-13 ENCOUNTER — Encounter: Payer: Self-pay | Admitting: Family Medicine

## 2017-11-13 ENCOUNTER — Other Ambulatory Visit: Payer: Self-pay

## 2017-11-13 ENCOUNTER — Ambulatory Visit: Payer: BC Managed Care – PPO | Admitting: Family Medicine

## 2017-11-13 VITALS — BP 122/70 | HR 67 | Temp 98.6°F | Resp 18 | Ht 73.5 in | Wt 193.4 lb

## 2017-11-13 DIAGNOSIS — Z113 Encounter for screening for infections with a predominantly sexual mode of transmission: Secondary | ICD-10-CM | POA: Diagnosis not present

## 2017-11-13 DIAGNOSIS — R300 Dysuria: Secondary | ICD-10-CM | POA: Diagnosis not present

## 2017-11-13 LAB — POCT URINALYSIS DIP (MANUAL ENTRY)
Bilirubin, UA: NEGATIVE
Blood, UA: NEGATIVE
Glucose, UA: NEGATIVE mg/dL
Ketones, POC UA: NEGATIVE mg/dL
Leukocytes, UA: NEGATIVE
Nitrite, UA: NEGATIVE
Protein Ur, POC: NEGATIVE mg/dL
Spec Grav, UA: 1.015 (ref 1.010–1.025)
Urobilinogen, UA: 0.2 E.U./dL
pH, UA: 7 (ref 5.0–8.0)
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
## 2017-11-13 NOTE — Patient Instructions (Signed)
     IF you received an x-ray today, you will receive an invoice from [facility]. Please contact [facility] at [phone] with questions or concerns regarding your invoice.   IF you received labwork today, you will receive an invoice from [facility]. Please contact [facility] at [phone] with questions or concerns regarding your invoice.   Our billing staff will not be able to assist you with questions regarding bills from these companies.  You will be contacted with the lab results as soon as they are available. The fastest way to get your results is to activate your My Chart account. Instructions are located on the last page of this paperwork. If you have not heard from us regarding the results in 2 weeks, please contact this office.     

## 2017-11-14 LAB — GC/CHLAMYDIA PROBE AMP
Chlamydia trachomatis, NAA: NEGATIVE
Neisseria gonorrhoeae by PCR: NEGATIVE

## 2017-11-14 LAB — TRICHOMONAS VAGINALIS, PROBE AMP: Trich vag by NAA: NEGATIVE

## 2017-11-16 LAB — HSV(HERPES SIMPLEX VRS) I + II AB-IGG
HSV 1 Glycoprotein G Ab, IgG: <0.91 index (ref 0.00–0.90)
HSV 2 IgG, Type Spec: <0.91 index (ref 0.00–0.90)

## 2017-11-16 LAB — RPR: RPR Ser Ql: NONREACTIVE

## 2017-11-16 LAB — HIV ANTIBODY (ROUTINE TESTING W REFLEX): HIV Screen 4th Generation wRfx: NONREACTIVE

## 2017-11-18 ENCOUNTER — Encounter: Payer: Self-pay | Admitting: Family Medicine

## 2017-11-18 ENCOUNTER — Other Ambulatory Visit: Payer: Self-pay

## 2017-11-18 ENCOUNTER — Ambulatory Visit: Payer: BC Managed Care – PPO | Admitting: Family Medicine

## 2017-11-18 VITALS — BP 112/78 | HR 84 | Temp 98.3°F | Resp 18 | Ht 73.5 in | Wt 192.8 lb

## 2017-11-18 DIAGNOSIS — R12 Heartburn: Secondary | ICD-10-CM | POA: Diagnosis not present

## 2017-11-18 DIAGNOSIS — R51 Headache: Secondary | ICD-10-CM | POA: Diagnosis not present

## 2017-11-18 DIAGNOSIS — Z8669 Personal history of other diseases of the nervous system and sense organs: Secondary | ICD-10-CM

## 2017-11-18 DIAGNOSIS — R509 Fever, unspecified: Secondary | ICD-10-CM

## 2017-11-18 DIAGNOSIS — R110 Nausea: Secondary | ICD-10-CM | POA: Diagnosis not present

## 2017-11-18 DIAGNOSIS — Z789 Other specified health status: Secondary | ICD-10-CM

## 2017-11-18 DIAGNOSIS — J029 Acute pharyngitis, unspecified: Secondary | ICD-10-CM

## 2017-11-18 DIAGNOSIS — R591 Generalized enlarged lymph nodes: Secondary | ICD-10-CM | POA: Diagnosis not present

## 2017-11-18 DIAGNOSIS — R631 Polydipsia: Secondary | ICD-10-CM | POA: Diagnosis not present

## 2017-11-18 DIAGNOSIS — R519 Headache, unspecified: Secondary | ICD-10-CM

## 2017-11-18 LAB — POCT CBC
Granulocyte percent: 72.1 %G (ref 37–80)
HCT, POC: 44.1 % (ref 43.5–53.7)
Hemoglobin: 14.8 g/dL (ref 14.1–18.1)
Lymph, poc: 1.6 (ref 0.6–3.4)
MCH: 29.6 pg (ref 27–31.2)
MCHC: 33.7 g/dL (ref 31.8–35.4)
MCV: 87.9 fL (ref 80–97)
MID (CBC): 0.4 (ref 0–0.9)
MPV: 7.4 fL (ref 0–99.8)
PLATELET COUNT, POC: 201 10*3/uL (ref 142–424)
POC Granulocyte: 5 (ref 2–6.9)
POC LYMPH PERCENT: 22.5 %L (ref 10–50)
POC MID %: 5.4 %M (ref 0–12)
RBC: 5.01 M/uL (ref 4.69–6.13)
RDW, POC: 12.8 %
WBC: 7 10*3/uL (ref 4.6–10.2)

## 2017-11-18 LAB — POCT RAPID STREP A (OFFICE): RAPID STREP A SCREEN: NEGATIVE

## 2017-11-18 LAB — GLUCOSE, POCT (MANUAL RESULT ENTRY): POC Glucose: 115 mg/dl — AB (ref 70–99)

## 2017-11-18 MED ORDER — AMOXICILLIN 500 MG PO CAPS: 500.0000 mg | ORAL_CAPSULE | Freq: Three times a day (TID) | ORAL | 0 refills | Status: DC

## 2017-11-18 NOTE — Progress Notes (Signed)
Subjective:  By signing my name below, I, Stann Ore, attest that this documentation has been prepared under the direction and in the presence of Meredith Staggers, MD. Electronically Signed: Stann Ore, Scribe. 11/18/2017 , 1:47 PM .  Patient was seen in Room 11 .   Patient ID: Donald Forbes, male    DOB: September 23, 1995, 22 y.o.   MRN: [id number] Chief Complaint  Patient presents with  . Headache    X 4 days- pt states on right side of head   HPI Wendy Mikles is a 22 y.o. male Here with right sided headache for the past 4 days. Problem list indicates seasonal allergic rhinitis. He was seen for possible herpes contact a few days ago; labs were negative.   Patient reports having bad throbbing headache for the past 4 days. He usually doesn't have headaches, and when he does, he's never had it this bad before. He's had associated nausea, but denies any vomiting. He's also had some intermittent blurry vision, even just looking at his phone. When his headache does come on, he has some photophobia and phonophobia, where he has to turn his radio down to minimum. He also informs having some hot flashes and hot and cold spells. He denies neck pain, or rash seen.   He recently returned to the [place] about 3 months ago (in February), after service in [place]. He's been doing well until about 4 days ago. He was on doxycycline for malaria prophylaxis, and stopped a few days after [place] to the states. He denies any headaches until 4 days ago. He denies any hiking or camping recently. He denies any known tick bites. He has an inside cat. He denies any known sick contact. He denies any known family history, but sister had headaches. He denies any known family history of aneurysm.   He also mentions having heartburn start around the same time (4 days ago) with sore throat, and he's been unable to drink soda, or eat greasy foods. He had heartburn in the past. He usually dips tobacco, but hasn't been able to  for the past few days. He hasn't taken heartburn prescription, only OTC heartburn medication. He has decreased appetite, only able to eat a small gravy biscuit yesterday and saltine crackers today, unable to eat chicken noodle soup. He's been trying to drink plenty of fluids. He denies abdominal pain.   He serves [employer], about 6 years in. He is a Curator for [employer]. He works near [employer] [place] Mondays through Fridays.   Patient Active Problem List   Diagnosis Date Noted  . Other seasonal allergic rhinitis 09/21/2014   Past Medical History:  Diagnosis Date  . Allergy   . Arthritis    Past Surgical History:  Procedure Laterality Date  . APPENDECTOMY     Allergies  Allergen Reactions  . Benadryl [Diphenhydramine]   . Codeine Other (See Comments)    Suspect GI intolerance but exact problem not known.   Prior to Admission medications   Medication Sig Start Date End Date Taking? Authorizing Provider  fluticasone (FLONASE) 50 MCG/ACT nasal spray 2 sprays into each nostril daily for allergies Patient not taking: Reported on 11/13/2017 09/21/14   Maurice March, MD  tiZANidine (ZANAFLEX) 4 MG capsule Take 1 capsule (4 mg total) by mouth 3 (three) times daily as needed for muscle spasms. Patient not taking: Reported on 03/29/2016 02/22/16   Ethelda Chick, MD   Social History   Socioeconomic History  . Marital  status: Single    Spouse name: Not on file  . Number of children: 0  . Years of education: Not on file  . Highest education level: Not on file  Occupational History  . Occupation: [employer]  Social Needs  . Financial resource strain: Not on file  . Food insecurity:    Worry: Not on file    Inability: Not on file  . Transportation needs:    Medical: Not on file    Non-medical: Not on file  Tobacco Use  . Smoking status: Never Smoker  . Smokeless tobacco: Current User    Types: Snuff  Substance and Sexual Activity  . Alcohol use:  Yes    Comment: occ  . Drug use: No  . Sexual activity: Not Currently  Lifestyle  . Physical activity:    Days per week: Not on file    Minutes per session: Not on file  . Stress: Not on file  Relationships  . Social connections:    Talks on phone: Not on file    Gets together: Not on file    Attends religious service: Not on file    Active member of club or organization: Not on file    Attends meetings of clubs or organizations: Not on file    Relationship status: Not on file  . Intimate partner violence:    Fear of current or ex partner: Not on file    Emotionally abused: Not on file    Physically abused: Not on file    Forced sexual activity: Not on file  Other Topics Concern  . Not on file  Social History Narrative   Education: senior in high school. Single. Exercise: Yes.   Tobacco: none   Review of Systems  Constitutional: Positive for appetite change, chills and fatigue. Negative for diaphoresis.       Hot and cold spells  HENT: Positive for sore throat.   Eyes: Positive for photophobia and visual disturbance.  Cardiovascular: Positive for chest pain (discomfort).  Gastrointestinal: Positive for nausea. Negative for abdominal pain, diarrhea and vomiting.  Genitourinary: Negative for dysuria, genital sores and urgency.  Skin: Negative for rash.  Neurological: Positive for headaches (right side). Negative for dizziness, weakness, light-headedness and numbness.       Objective:   Physical Exam  Constitutional: He is oriented to person, place, and time. He appears well-developed and well-nourished. No distress.  HENT:  Head: Normocephalic and atraumatic.  Mouth/Throat: Posterior oropharyngeal erythema (minimal, right side) present. No oropharyngeal exudate.  Eyes: Pupils are equal, round, and reactive to light. EOM are normal.  Neck: Neck supple.  Cardiovascular: Normal rate.  Pulmonary/Chest: Effort normal. No respiratory distress.  Abdominal: There is  tenderness (discomfort) in the epigastric area. There is no tenderness at McBurney's point and negative Murphy's sign.  Musculoskeletal: Normal range of motion.  Lymphadenopathy:    He has cervical adenopathy (enlarged right posterior cervical).    He has no axillary adenopathy.       Right: No epitrochlear adenopathy present.       Left: No epitrochlear adenopathy present.  Enlarged right posterior cervical node, mobile; small right sided AC node  Neurological: He is alert and oriented to person, place, and time. He displays a negative Romberg sign.  No pronator drift  Skin: Skin is warm and dry. No rash noted.  Skin: no rash, blisters, or bumps  Psychiatric: He has a normal mood and affect. His behavior is normal.  Nursing  note and vitals reviewed.   Vitals:   11/18/17 1319  BP: 112/78  Pulse: 84  Resp: 18  Temp: 98.3 F (36.8 C)  TempSrc: Oral  SpO2: 98%  Weight: 192 lb 12.8 oz (87.5 kg)  Height: 6' 1.5" (1.867 m)   Results for orders placed or performed in visit on 11/18/17  POCT glucose (manual entry)  Result Value Ref Range   POC Glucose 115 (A) 70 - 99 mg/dl  POCT CBC  Result Value Ref Range   WBC 7.0 4.6 - 10.2 K/uL   Lymph, poc 1.6 0.6 - 3.4   POC LYMPH PERCENT 22.5 10 - 50 %L   MID (cbc) 0.4 0 - 0.9   POC MID % 5.4 0 - 12 %M   POC Granulocyte 5.0 2 - 6.9   Granulocyte percent 72.1 37 - 80 %G   RBC 5.01 4.69 - 6.13 M/uL   Hemoglobin 14.8 14.1 - 18.1 g/dL   HCT, POC 60.4 54.0 - 53.7 %   MCV 87.9 80 - 97 fL   MCH, POC 29.6 27 - 31.2 pg   MCHC 33.7 31.8 - 35.4 g/dL   RDW, POC 98.1 %   Platelet Count, POC 201 142 - 424 K/uL   MPV 7.4 0 - 99.8 fL  POCT rapid strep A  Result Value Ref Range   Rapid Strep A Screen Negative Negative       Assessment & Plan:    Jakyri Brunkhorst is a 22 y.o. male Acute nonintractable headache, unspecified headache type - Plan: Parasite Exam, Blood  H/O blurred vision - Plan: POCT glucose (manual entry), Comprehensive  metabolic panel  Sore throat - Plan: POCT CBC, POCT rapid strep A, Culture, Group A Strep, amoxicillin (AMOXIL) 500 MG capsule  LAD (lymphadenopathy) - Plan: POCT CBC, Comprehensive metabolic panel, HIV 1 RNA quant-no reflex-bld, Parasite Exam, Blood  Polydipsia - Plan: POCT glucose (manual entry)  Nausea without vomiting - Plan: Comprehensive metabolic panel  Fever and chills - Plan: HIV 1 RNA quant-no reflex-bld, Parasite Exam, Blood  History of foreign travel - Plan: Parasite Exam, Blood, CANCELED: Malaria Smear  Heartburn   Headache with sore throat, other symptoms as above including subjective fever/chills.  With associated sore throat and slight lymphadenopathy, differential includes strep throat versus mono versus other viral illness.  Did have foreign travel and did not complete malaria prophylaxis, but based on timing since he has been back in the states malaria be much less likely, as discussed with infectious disease.  Nonfocal neurologic exam.  -Start amoxicillin, check throat culture for potential strep throat.  Potential for rash discussed if this is mono.  -Check thick and thin blood smears for malaria  -Tylenol or ibuprofen as needed for headache/myalgias CMP pending to evaluate Hepatic function  -Check HIV RPR for more acute testing given recent possible STI exposure. most recent STI testing was overall reassuring.   -Recheck 3 days, RTC/ER precautions given if worsening sooner Meds ordered this encounter  Medications  . amoxicillin (AMOXIL) 500 MG capsule    Sig: Take 1 capsule (500 mg total) by mouth 3 (three) times daily.    Dispense:  30 capsule    Refill:  0   Patient Instructions   Blood counts, rapid strep test were normal in the office.  However with headache, hot and cold spells, swollen lymph nodes and sore throat you can start amoxicillin for now for possible strep throat.  However mono or other viral infections are also possible.  It would be less likely  for this to be malaria given the amount of time he has been back in the states.  Please follow-up with me in the next 3 days to review your blood work and recheck exam.  If any new symptoms including new rash, return sooner as that may be part of the illness or possible reaction to the medication.   For headache, over-the-counter ibuprofen or Tylenol if needed for now, make sure you are drinking plenty of fluids, heartburn medicine such as Pepcid or Zantac is okay for now as well.  Return to the clinic or go to the nearest emergency room if any of your symptoms worsen or new symptoms occur.    Sore Throat A sore throat is pain, burning, irritation, or scratchiness in the throat. When you have a sore throat, you may feel pain or tenderness in your throat when you swallow or talk. Many things can cause a sore throat, including:  An infection.  Seasonal allergies.  Dryness in the air.  Irritants, such as smoke or pollution.  Gastroesophageal reflux disease (GERD).  A tumor.  A sore throat is often the first sign of another sickness. It may happen with other symptoms, such as coughing, sneezing, fever, and swollen neck glands. Most sore throats go away without medical treatment. Follow these instructions at home:  Take over-the-counter medicines only as told by your health care provider.  Drink enough fluids to keep your urine clear or pale yellow.  Rest as needed.  To help with pain, try: ? Sipping warm liquids, such as broth, herbal tea, or warm water. ? Eating or drinking cold or frozen liquids, such as frozen ice pops. ? Gargling with a salt-water mixture 3-4 times a day or as needed. To make a salt-water mixture, completely dissolve -1 tsp of salt in 1 cup of warm water. ? Sucking on hard candy or throat lozenges. ? Putting a cool-mist humidifier in your bedroom at night to moisten the air. ? Sitting in the bathroom with the door closed for 5-10 minutes while you run hot  water in the shower.  Do not use any tobacco products, such as cigarettes, chewing tobacco, and e-cigarettes. If you need help quitting, ask your health care provider. Contact a health care provider if:  You have a fever for more than 2-3 days.  You have symptoms that last (are persistent) for more than 2-3 days.  Your throat does not get better within 7 days.  You have a fever and your symptoms suddenly get worse. Get help right away if:  You have difficulty breathing.  You cannot swallow fluids, soft foods, or your saliva.  You have increased swelling in your throat or neck.  You have persistent nausea and vomiting. This information is not intended to replace advice given to you by your health care provider. Make sure you discuss any questions you have with your health care provider. Document Released: 08/02/2004 Document Revised: 02/19/2016 Document Reviewed: 04/15/2015 Elsevier Interactive Patient Education  2018 Elsevier Inc.   General Headache Without Cause A headache is pain or discomfort felt around the head or neck area. There are many causes and types of headaches. In some cases, the cause may not be found. Follow these instructions at home: Managing pain  Take over-the-counter and prescription medicines only as told by your doctor.  Lie down in a dark, quiet room when you have a headache.  If directed, apply ice to the head and neck area: ?  Put ice in a plastic bag. ? Place a towel between your skin and the bag. ? Leave the ice on for 20 minutes, 2-3 times per day.  Use a heating pad or hot shower to apply heat to the head and neck area as told by your doctor.  Keep lights dim if bright lights bother you or make your headaches worse. Eating and drinking  Eat meals on a regular schedule.  Lessen how much alcohol you drink.  Lessen how much caffeine you drink, or stop drinking caffeine. General instructions  Keep all follow-up visits as told by your  doctor. This is important.  Keep a journal to find out if certain things bring on headaches. For example, write down: ? What you eat and drink. ? How much sleep you get. ? Any change to your diet or medicines.  Relax by getting a massage or doing other relaxing activities.  Lessen stress.  Sit up straight. Do not tighten (tense) your muscles.  Do not use tobacco products. This includes cigarettes, chewing tobacco, or e-cigarettes. If you need help quitting, ask your doctor.  Exercise regularly as told by your doctor.  Get enough sleep. This often means 7-9 hours of sleep. Contact a doctor if:  Your symptoms are not helped by medicine.  You have a headache that feels different than the other headaches.  You feel sick to your stomach (nauseous) or you throw up (vomit).  You have a fever. Get help right away if:  Your headache becomes really bad.  You keep throwing up.  You have a stiff neck.  You have trouble seeing.  You have trouble speaking.  You have pain in the eye or ear.  Your muscles are weak or you lose muscle control.  You lose your balance or have trouble walking.  You feel like you will pass out (faint) or you pass out.  You have confusion. This information is not intended to replace advice given to you by your health care provider. Make sure you discuss any questions you have with your health care provider. Document Released: 04/03/2008 Document Revised: 12/01/2015 Document Reviewed: 10/18/2014 Elsevier Interactive Patient Education  2018 [employer].    IF you received an x-ray today, you will receive an invoice from [facility]. Please contact [facility] at [phone] with questions or concerns regarding your invoice.   IF you received labwork today, you will receive an invoice from [facility]. Please contact [facility] at [phone] with questions or concerns regarding your invoice.   Our billing staff will not be able to  assist you with questions regarding bills from these companies.  You will be contacted with the lab results as soon as they are available. The fastest way to get your results is to activate your My Chart account. Instructions are located on the last page of this paperwork. If you have not heard from [place] regarding the results in 2 weeks, please contact this office.      I personally performed the services described in this documentation, which was scribed in my presence. The recorded information has been reviewed and considered for accuracy and completeness, addended by me as needed, and agree with information above.  Signed,   Meredith Staggers, MD Primary Care at [facility].  11/20/17 1:07 PM

## 2017-11-18 NOTE — Patient Instructions (Addendum)
Blood counts, rapid strep test were normal in the office.  However with headache, hot and cold spells, swollen lymph nodes and sore throat you can start amoxicillin for now for possible strep throat.  However mono or other viral infections are also possible.  It would be less likely for this to be malaria given the amount of time [place].  Please follow-up with me in the next 3 days to review your blood work and recheck exam.  If any new symptoms including new rash, return sooner as that may be part of the illness or possible reaction to the medication.   For headache, over-the-counter ibuprofen or Tylenol if needed for now, make sure you are drinking plenty of fluids, heartburn medicine such as Pepcid or Zantac is okay for now as well.  Return to the clinic or go to the nearest emergency room if any of your symptoms worsen or new symptoms occur.    Sore Throat A sore throat is pain, burning, irritation, or scratchiness in the throat. When you have a sore throat, you may feel pain or tenderness in your throat when you swallow or talk. Many things can cause a sore throat, including:  An infection.  Seasonal allergies.  Dryness in the air.  Irritants, such as smoke or pollution.  Gastroesophageal reflux disease (GERD).  A tumor.  A sore throat is often the first sign of another sickness. It may happen with other symptoms, such as coughing, sneezing, fever, and swollen neck glands. Most sore throats go away without medical treatment. Follow these instructions at home:  Take over-the-counter medicines only as told by your health care provider.  Drink enough fluids to keep your urine clear or pale yellow.  Rest as needed.  To help with pain, try: ? Sipping warm liquids, such as broth, herbal tea, or warm water. ? Eating or drinking cold or frozen liquids, such as frozen ice pops. ? Gargling with a salt-water mixture 3-4 times a day or as needed. To make a  salt-water mixture, completely dissolve -1 tsp of salt in 1 cup of warm water. ? Sucking on hard candy or throat lozenges. ? Putting a cool-mist humidifier in your bedroom at night to moisten the air. ? Sitting in the bathroom with the door closed for 5-10 minutes while you run hot water in the shower.  Do not use any tobacco products, such as cigarettes, chewing tobacco, and e-cigarettes. If you need help quitting, ask your health care provider. Contact a health care provider if:  You have a fever for more than 2-3 days.  You have symptoms that last (are persistent) for more than 2-3 days.  Your throat does not get better within 7 days.  You have a fever and your symptoms suddenly get worse. Get help right away if:  You have difficulty breathing.  You cannot swallow fluids, soft foods, or your saliva.  You have increased swelling in your throat or neck.  You have persistent nausea and vomiting. This information is not intended to replace advice given to you by your health care provider. Make sure you discuss any questions you have with your health care provider. Document Released: 08/02/2004 Document Revised: 02/19/2016 Document Reviewed: 04/15/2015 Elsevier Interactive Patient Education  2018 Elsevier Inc.   General Headache Without Cause A headache is pain or discomfort felt around the head or neck area. There are many causes and types of headaches. In some cases, the cause may not be found.  Follow these instructions at home: Managing pain  Take over-the-counter and prescription medicines only as told by your doctor.  Lie down in a dark, quiet room when you have a headache.  If directed, apply ice to the head and neck area: ? Put ice in a plastic bag. ? Place a towel between your skin and the bag. ? Leave the ice on for 20 minutes, 2-3 times per day.  Use a heating pad or hot shower to apply heat to the head and neck area as told by your doctor.  Keep lights dim if  bright lights bother you or make your headaches worse. Eating and drinking  Eat meals on a regular schedule.  Lessen how much alcohol you drink.  Lessen how much caffeine you drink, or stop drinking caffeine. General instructions  Keep all follow-up visits as told by your doctor. This is important.  Keep a journal to find out if certain things bring on headaches. For example, write down: ? What you eat and drink. ? How much sleep you get. ? Any change to your diet or medicines.  Relax by getting a massage or doing other relaxing activities.  Lessen stress.  Sit up straight. Do not tighten (tense) your muscles.  Do not use tobacco products. This includes cigarettes, chewing tobacco, or e-cigarettes. If you need help quitting, ask your doctor.  Exercise regularly as told by your doctor.  Get enough sleep. This often means 7-9 hours of sleep. Contact a doctor if:  Your symptoms are not helped by medicine.  You have a headache that feels different than the other headaches.  You feel sick to your stomach (nauseous) or you throw up (vomit).  You have a fever. Get help right away if:  Your headache becomes really bad.  You keep throwing up.  You have a stiff neck.  You have trouble seeing.  You have trouble speaking.  You have pain in the eye or ear.  Your muscles are weak or you lose muscle control.  You lose your balance or have trouble walking.  You feel like you will pass out (faint) or you pass out.  You have confusion. This information is not intended to replace advice given to you by your health care provider. Make sure you discuss any questions you have with your health care provider. Document Released: 04/03/2008 Document Revised: 12/01/2015 Document Reviewed: 10/18/2014 Elsevier Interactive Patient Education  2018 [employer].    IF you received an x-ray today, you will receive an invoice from [facility]. Please contact [facility]  Radiology at [phone] with questions or concerns regarding your invoice.   IF you received labwork today, you will receive an invoice from [facility]. Please contact [facility] at [phone] with questions or concerns regarding your invoice.   Our billing staff will not be able to assist you with questions regarding bills from these companies.  You will be contacted with the lab results as soon as they are available. The fastest way to get your results is to activate your My Chart account. Instructions are located on the last page of this paperwork. If you have not heard from [place] regarding the results in 2 weeks, please contact this office.

## 2017-11-19 LAB — PARASITE EXAM, BLOOD

## 2017-11-19 LAB — COMPREHENSIVE METABOLIC PANEL
ALK PHOS: 59 IU/L (ref 39–117)
ALT: 20 IU/L (ref 0–44)
AST: 24 IU/L (ref 0–40)
Albumin/Globulin Ratio: 2 (ref 1.2–2.2)
Albumin: 4.5 g/dL (ref 3.5–5.5)
BILIRUBIN TOTAL: 0.3 mg/dL (ref 0.0–1.2)
BUN/Creatinine Ratio: 8 — ABNORMAL LOW (ref 9–20)
BUN: 8 mg/dL (ref 6–20)
CO2: 22 mmol/L (ref 20–29)
Calcium: 9.2 mg/dL (ref 8.7–10.2)
Chloride: 105 mmol/L (ref 96–106)
Creatinine, Ser: 1.01 mg/dL (ref 0.76–1.27)
GFR calc Af Amer: 121 mL/min/{1.73_m2} (ref >59)
GFR calc non Af Amer: 104 mL/min/{1.73_m2} (ref >59)
GLUCOSE: 104 mg/dL — AB (ref 65–99)
Globulin, Total: 2.3 g/dL (ref 1.5–4.5)
Potassium: 4.4 mmol/L (ref 3.5–5.2)
Sodium: 145 mmol/L — ABNORMAL HIGH (ref 134–144)
Total Protein: 6.8 g/dL (ref 6.0–8.5)

## 2017-11-19 LAB — HIV-1 RNA QUANT-NO REFLEX-BLD

## 2017-11-20 ENCOUNTER — Ambulatory Visit: Payer: BC Managed Care – PPO | Admitting: Physician Assistant

## 2017-11-20 LAB — CULTURE, GROUP A STREP: STREP A CULTURE: NEGATIVE

## 2017-11-21 ENCOUNTER — Ambulatory Visit: Payer: BC Managed Care – PPO | Admitting: Family Medicine

## 2017-11-21 ENCOUNTER — Ambulatory Visit (INDEPENDENT_AMBULATORY_CARE_PROVIDER_SITE_OTHER): Payer: BC Managed Care – PPO

## 2017-11-21 ENCOUNTER — Encounter: Payer: Self-pay | Admitting: Family Medicine

## 2017-11-21 VITALS — BP 100/60 | HR 98 | Temp 98.3°F | Ht 74.5 in | Wt 187.6 lb

## 2017-11-21 DIAGNOSIS — R05 Cough: Secondary | ICD-10-CM

## 2017-11-21 DIAGNOSIS — M791 Myalgia, unspecified site: Secondary | ICD-10-CM

## 2017-11-21 DIAGNOSIS — J029 Acute pharyngitis, unspecified: Secondary | ICD-10-CM | POA: Diagnosis not present

## 2017-11-21 DIAGNOSIS — R12 Heartburn: Secondary | ICD-10-CM

## 2017-11-21 DIAGNOSIS — R059 Cough, unspecified: Secondary | ICD-10-CM

## 2017-11-21 DIAGNOSIS — R51 Headache: Secondary | ICD-10-CM | POA: Diagnosis not present

## 2017-11-21 DIAGNOSIS — R519 Headache, unspecified: Secondary | ICD-10-CM

## 2017-11-21 DIAGNOSIS — K130 Diseases of lips: Secondary | ICD-10-CM | POA: Diagnosis not present

## 2017-11-21 LAB — POCT CBC
Granulocyte percent: 63.4 %G (ref 37–80)
HCT, POC: 48.6 % (ref 43.5–53.7)
Hemoglobin: 15.9 g/dL (ref 14.1–18.1)
Lymph, poc: 1.8 (ref 0.6–3.4)
MCH: 29.4 pg (ref 27–31.2)
MCHC: 32.7 g/dL (ref 31.8–35.4)
MCV: 89.8 fL (ref 80–97)
MID (CBC): 0.4 (ref 0–0.9)
MPV: 7.5 fL (ref 0–99.8)
POC Granulocyte: 3.8 (ref 2–6.9)
POC LYMPH PERCENT: 30.3 %L (ref 10–50)
POC MID %: 6.3 % (ref 0–12)
Platelet Count, POC: 204 10*3/uL (ref 142–424)
RBC: 5.41 M/uL (ref 4.69–6.13)
RDW, POC: 12.8 %
WBC: 6 10*3/uL (ref 4.6–10.2)

## 2017-11-21 LAB — POC INFLUENZA A&B (BINAX/QUICKVUE)
Influenza A, POC: NEGATIVE
Influenza B, POC: NEGATIVE

## 2017-11-21 MED ORDER — MUPIROCIN 2 % EX OINT: 1.0000 "application " | TOPICAL_OINTMENT | Freq: Two times a day (BID) | CUTANEOUS | 0 refills | Status: DC

## 2017-11-21 MED ORDER — RANITIDINE HCL 150 MG PO TABS
150.0000 mg | ORAL_TABLET | Freq: Two times a day (BID) | ORAL | 0 refills | Status: DC
Start: 2017-11-21 — End: 2020-02-29

## 2017-11-21 NOTE — Progress Notes (Signed)
Subjective:  By signing my name below, I, Donald Forbes, attest that this documentation has been prepared under the direction and in the presence of Donald Flood, MD Electronically Signed: Charline Bills, ED Scribe 11/21/2017 at 10:39 AM.   Patient ID: Donald Forbes, male    DOB: 08/23/1995, 22 y.o.   MRN: [id number]  Chief Complaint  Patient presents with  . Heartburn    last couple of days around 5/13 started. (usually when trying to ear)  . leg aches    both legs. plast couple of days. throbing pain off and on with both legs   HPI Donald Forbes is a 22 y.o. male who presents to Primary Care at [facility] for f/u. Seen 3 days ago with multiple symptoms of HA, sore throat, heartburn. Rapid strep was neg. Throat culture obtained which was also neg. Initially started on amoxicillin. Hade Malaria testing which was normal. Neg HIV RNA. CMP with borderline glucose, borderline sodium at 145, normal LFTs, normal CBC at last visit.  Pt reports that HAs have improved and sore throat has resolved. He is still experiencing heartburn x 4-5 days, nausea, night sweats, dry cough x 5 days and now having intermittent throbbing bilateral LE pain. Also reports a rash to the lower lip that began as a small bump that he burst 2-3 days ago. Pt has tried chewable Rolaids and [employer] brand antiacids without significant relief. He has only been able to eat jello, crackers, Ensure and water due to heartburn. Denies measured a fever, sneezing, blood in stools, melena, vomiting, recent exercise, h/o ulcers or cold sores.  Patient Active Problem List   Diagnosis Date Noted  . Other seasonal allergic rhinitis 09/21/2014   Past Medical History:  Diagnosis Date  . Allergy   . Arthritis    Past Surgical History:  Procedure Laterality Date  . APPENDECTOMY     Allergies  Allergen Reactions  . Benadryl [Diphenhydramine]   . Codeine Other (See Comments)    Suspect GI intolerance but exact problem not known.    Prior to Admission medications   Medication Sig Start Date End Date Taking? Authorizing Provider  amoxicillin (AMOXIL) 500 MG capsule Take 1 capsule (500 mg total) by mouth 3 (three) times daily. 11/18/17  Yes Donald Flood, MD   Social History   Socioeconomic History  . Marital status: Single    Spouse name: Not on file  . Number of children: 0  . Years of education: Not on file  . Highest education level: Not on file  Occupational History  . Occupation: [employer]  Social Needs  . Financial resource strain: Not on file  . Food insecurity:    Worry: Not on file    Inability: Not on file  . Transportation needs:    Medical: Not on file    Non-medical: Not on file  Tobacco Use  . Smoking status: Never Smoker  . Smokeless tobacco: Current User    Types: Snuff  Substance and Sexual Activity  . Alcohol use: Yes    Comment: occ  . Drug use: No  . Sexual activity: Not Currently  Lifestyle  . Physical activity:    Days per week: Not on file    Minutes per session: Not on file  . Stress: Not on file  Relationships  . Social connections:    Talks on phone: Not on file    Gets together: Not on file    Attends religious service: Not on file  Active member of club or organization: Not on file    Attends meetings of clubs or organizations: Not on file    Relationship status: Not on file  . Intimate partner violence:    Fear of current or ex partner: Not on file    Emotionally abused: Not on file    Physically abused: Not on file    Forced sexual activity: Not on file  Other Topics Concern  . Not on file  Social History Narrative   Education: senior in high school. Single. Exercise: Yes.   Tobacco: none   Review of Systems  Constitutional: Positive for diaphoresis. Negative for fever.  HENT: Positive for mouth sores (lower lip). Negative for sneezing and sore throat (resolved).   Respiratory: Positive for cough.   Gastrointestinal: Positive for  nausea. Negative for blood in stool and vomiting.  Musculoskeletal: Positive for myalgias.  Neurological: Negative for headaches (resolved).      Objective:   Physical Exam  Constitutional: He is oriented to person, place, and time. He appears well-developed and well-nourished.  HENT:  Head: Normocephalic and atraumatic.  Right Ear: Tympanic membrane, external ear and ear canal normal.  Left Ear: Tympanic membrane, external ear and ear canal normal.  Nose: No rhinorrhea.  Mouth/Throat: Oropharynx is clear and moist and mucous membranes are normal. No oropharyngeal exudate or posterior oropharyngeal erythema.  Mouth/Throat: moist including visualized oropharynx.  Small pustule on mid to lower lip. Slight honey colored crust.  Eyes: Pupils are equal, round, and reactive to light. Conjunctivae are normal.  Neck: Neck supple.  Cardiovascular: Normal rate, regular rhythm, normal heart sounds and intact distal pulses.  No murmur heard. Pulmonary/Chest: Effort normal and breath sounds normal. He has no wheezes. He has no rhonchi. He has no rales.  Abdominal: Soft. There is no tenderness.  Lymphadenopathy:    He has no cervical adenopathy.  1 palpable node at the R posterior cervical and R AC  Neurological: He is alert and oriented to person, place, and time.  Skin: Skin is warm and dry. No rash noted.  Psychiatric: He has a normal mood and affect. His behavior is normal.  Vitals reviewed.  Vitals:   11/21/17 0943  BP: 100/60  Pulse: 98  Temp: 98.3 F (36.8 C)  TempSrc: Oral  SpO2: 98%  Weight: 187 lb 9.6 oz (85.1 kg)  Height: 6' 2.5" (1.892 m)   Results for orders placed or performed in visit on 11/21/17  CK  Result Value Ref Range   Total CK 64 24 - 204 U/L  POCT CBC  Result Value Ref Range   WBC 6.0 4.6 - 10.2 K/uL   Lymph, poc 1.8 0.6 - 3.4   POC LYMPH PERCENT 30.3 10 - 50 %L   MID (cbc) 0.4 0 - 0.9   POC MID % 6.3 0 - 12 %M   POC Granulocyte 3.8 2 - 6.9   Granulocyte  percent 63.4 37 - 80 %G   RBC 5.41 4.69 - 6.13 M/uL   Hemoglobin 15.9 14.1 - 18.1 g/dL   HCT, POC 16.1 09.6 - 53.7 %   MCV 89.8 80 - 97 fL   MCH, POC 29.4 27 - 31.2 pg   MCHC 32.7 31.8 - 35.4 g/dL   RDW, POC 04.5 %   Platelet Count, POC 204 142 - 424 K/uL   MPV 7.5 0 - 99.8 fL  POC Influenza A&B(BINAX/QUICKVUE)  Result Value Ref Range   Influenza A, POC Negative Negative  Influenza B, POC Negative Negative   Dg Chest 2 View  Result Date: 11/21/2017 CLINICAL DATA:  Cough, heartburn EXAM: CHEST - 2 VIEW COMPARISON:  06/03/2011 FINDINGS: Heart and mediastinal contours are within normal limits. No focal opacities or effusions. No acute bony abnormality. IMPRESSION: No active cardiopulmonary disease. Electronically Signed   By: Charlett Nose M.D.   On: 11/21/2017 11:19      Assessment & Plan:    Lula Michaux is a 22 y.o. male Heartburn - Plan: DG Chest 2 View, ranitidine (ZANTAC) 150 MG tablet  -Trial of Zantac, discussed trigger foods last visit.  Reports some heartburn in the past.  Tolerating p.o. Fluids,  minimal foods as above.  If not improving consider GI eval, RTC precautions if acutely worsening  Sore throat  -Improved, okay to stop amoxicillin at this time as negative strep as well as culture.  Still may be viral syndrome, including possible mono although I did not appreciate exudative tonsillitis and symptoms are improving.  Nonintractable episodic headache, unspecified headache type  -Headache is also improving, still possible viral illness.  RTC precautions if recurrence/worsening  Myalgia - Plan: CK  -Check CPK, but as above may be part of concurrent illness  Cough - Plan: DG Chest 2 View, POCT CBC, POC Influenza A&B(BINAX/QUICKVUE)  -Lungs clear, reassuring chest x-ray, reassuring CBC and flu testing.  Symptomatic care discussed, RTC precautions if worsening.  Lip lesion - Plan: mupirocin ointment (BACTROBAN) 2 %  -Possible initial abrasion or pustule with possible  early secondary infection, slight crust, start  and Bactroban ointment.  With RTC precautions  Meds ordered this encounter  Medications  . ranitidine (ZANTAC) 150 MG tablet    Sig: Take 1 tablet (150 mg total) by mouth 2 (two) times daily.    Dispense:  60 tablet    Refill:  0  . mupirocin ointment (BACTROBAN) 2 %    Sig: Apply 1 application topically 2 (two) times daily. To bump on lower lip for 1 week.    Dispense:  22 g    Refill:  0   Patient Instructions    Blood counts looked okay again today as well as your chest x-ray.  As strep testing was negative/normal, okay to stop amoxicillin at this time.  You may be fighting off symptoms of a virus, and those should continue to improve through the week to the next week.  For heartburn, I have printed more information below, but start Zantac twice per day and follow-up if that is not improving within the next 4 to 5 days.  Tylenol if needed for aches and pains, but I will check a muscle enzyme test from blood work today.Return to the clinic or go to the nearest emergency room if any of your symptoms worsen or new symptoms occur.  Please follow-up within the next 10 to 14 days to make sure the swollen lymph nodes are improving, sooner if worse.   Heartburn Heartburn is a type of pain or discomfort that can happen in the throat or chest. It is often described as a burning pain. It may also cause a bad taste in the mouth. Heartburn may feel worse when you lie down or bend over, and it is often worse at night. Heartburn may be caused by stomach contents that move back up into the esophagus (reflux). Follow these instructions at home: Take these actions to decrease your discomfort and to help avoid complications. Diet  Follow a diet as recommended by your health  care provider. This may involve avoiding foods and drinks such as: ? Coffee and tea (with or without caffeine). ? Drinks that contain alcohol. ? Energy drinks and sports  drinks. ? Carbonated drinks or sodas. ? Chocolate and cocoa. ? Peppermint and mint flavorings. ? Garlic and onions. ? Horseradish. ? Spicy and acidic foods, including peppers, chili powder, curry powder, vinegar, hot sauces, and barbecue sauce. ? Citrus fruit juices and citrus fruits, such as oranges, lemons, and limes. ? Tomato-based foods, such as red sauce, chili, salsa, and pizza with red sauce. ? Fried and fatty foods, such as donuts, french fries, potato chips, and high-fat dressings. ? High-fat meats, such as hot dogs and fatty cuts of red and white meats, such as rib eye steak, sausage, ham, and bacon. ? High-fat dairy items, such as whole milk, butter, and cream cheese.  Eat small, frequent meals instead of large meals.  Avoid drinking large amounts of liquid with your meals.  Avoid eating meals during the 2-3 hours before bedtime.  Avoid lying down right after you eat.  Do not exercise right after you eat. General instructions  Pay attention to any changes in your symptoms.  Take over-the-counter and prescription medicines only as told by your health care provider. Do not take aspirin, ibuprofen, or other NSAIDs unless your health care provider told you to do so.  Do not use any tobacco products, including cigarettes, chewing tobacco, and e-cigarettes. If you need help quitting, ask your health care provider.  Wear loose-fitting clothing. Do not wear anything tight around your waist that causes pressure on your abdomen.  Raise (elevate) the head of your bed about 6 inches (15 cm).  Try to reduce your stress, such as with yoga or meditation. If you need help reducing stress, ask your health care provider.  If you are overweight, reduce your weight to an amount that is healthy for you. Ask your health care provider for guidance about a safe weight loss goal.  Keep all follow-up visits as told by your health care provider. This is important. Contact a health care  provider if:  You have new symptoms.  You have unexplained weight loss.  You have difficulty swallowing, or it hurts to swallow.  You have wheezing or a persistent cough.  Your symptoms do not improve with treatment.  You have frequent heartburn for more than two weeks. Get help right away if:  You have pain in your arms, neck, jaw, teeth, or back.  You feel sweaty, dizzy, or light-headed.  You have chest pain or shortness of breath.  You vomit and your vomit looks like blood or coffee grounds.  Your stool is bloody or black. This information is not intended to replace advice given to you by your health care provider. Make sure you discuss any questions you have with your health care provider. Document Released: 11/11/2008 Document Revised: 12/01/2015 Document Reviewed: 10/20/2014 Elsevier Interactive Patient Education  2018 [employer].   IF you received an x-ray today, you will receive an invoice from [facility]. Please contact [facility] at [phone] with questions or concerns regarding your invoice.   IF you received labwork today, you will receive an invoice from [place]. Please contact [facility] at [phone] with questions or concerns regarding your invoice.   Our billing staff will not be able to assist you with questions regarding bills from these companies.  You will be contacted with the lab results as soon as they are available. The fastest way to  get your results is to activate your My Chart account. Instructions are located on the last page of this paperwork. If you have not heard from [place] regarding the results in 2 weeks, please contact this office.       I personally performed the services described in this documentation, which was scribed in my presence. The recorded information has been reviewed and considered for accuracy and completeness, addended by me as needed, and agree with information above.  Signed,   Meredith Staggers,  MD Primary Care at [facility].  11/22/17 8:08 AM

## 2017-11-21 NOTE — Patient Instructions (Addendum)
Blood counts looked okay again today as well as your chest x-ray.  As strep testing was negative/normal, okay to stop amoxicillin at this time.  You may be fighting off symptoms of a virus, and those should continue to improve through the week to the next week.  For heartburn, I have printed more information below, but start Zantac twice per day and follow-up if that is not improving within the next 4 to 5 days.  Tylenol if needed for aches and pains, but I will check a muscle enzyme test from blood work today.Return to the clinic or go to the nearest emergency room if any of your symptoms worsen or new symptoms occur.  Please follow-up within the next 10 to 14 days to make sure the swollen lymph nodes are improving, sooner if worse.   Heartburn Heartburn is a type of pain or discomfort that can happen in the throat or chest. It is often described as a burning pain. It may also cause a bad taste in the mouth. Heartburn may feel worse when you lie down or bend over, and it is often worse at night. Heartburn may be caused by stomach contents that move back up into the esophagus (reflux). Follow these instructions at home: Take these actions to decrease your discomfort and to help avoid complications. Diet  Follow a diet as recommended by your health care provider. This may involve avoiding foods and drinks such as: ? Coffee and tea (with or without caffeine). ? Drinks that contain alcohol. ? Energy drinks and sports drinks. ? Carbonated drinks or sodas. ? Chocolate and cocoa. ? Peppermint and mint flavorings. ? Garlic and onions. ? Horseradish. ? Spicy and acidic foods, including peppers, chili powder, curry powder, vinegar, hot sauces, and barbecue sauce. ? Citrus fruit juices and citrus fruits, such as oranges, lemons, and limes. ? Tomato-based foods, such as red sauce, chili, salsa, and pizza with red sauce. ? Fried and fatty foods, such as donuts, french fries, potato chips, and high-fat  dressings. ? High-fat meats, such as hot dogs and fatty cuts of red and white meats, such as rib eye steak, sausage, ham, and bacon. ? High-fat dairy items, such as whole milk, butter, and cream cheese.  Eat small, frequent meals instead of large meals.  Avoid drinking large amounts of liquid with your meals.  Avoid eating meals during the 2-3 hours before bedtime.  Avoid lying down right after you eat.  Do not exercise right after you eat. General instructions  Pay attention to any changes in your symptoms.  Take over-the-counter and prescription medicines only as told by your health care provider. Do not take aspirin, ibuprofen, or other NSAIDs unless your health care provider told you to do so.  Do not use any tobacco products, including cigarettes, chewing tobacco, and e-cigarettes. If you need help quitting, ask your health care provider.  Wear loose-fitting clothing. Do not wear anything tight around your waist that causes pressure on your abdomen.  Raise (elevate) the head of your bed about 6 inches (15 cm).  Try to reduce your stress, such as with yoga or meditation. If you need help reducing stress, ask your health care provider.  If you are overweight, reduce your weight to an amount that is healthy for you. Ask your health care provider for guidance about a safe weight loss goal.  Keep all follow-up visits as told by your health care provider. This is important. Contact a health care provider if:  You have  new symptoms.  You have unexplained weight loss.  You have difficulty swallowing, or it hurts to swallow.  You have wheezing or a persistent cough.  Your symptoms do not improve with treatment.  You have frequent heartburn for more than two weeks. Get help right away if:  You have pain in your arms, neck, jaw, teeth, or back.  You feel sweaty, dizzy, or light-headed.  You have chest pain or shortness of breath.  You vomit and your vomit looks like blood  or coffee grounds.  Your stool is bloody or black. This information is not intended to replace advice given to you by your health care provider. Make sure you discuss any questions you have with your health care provider. Document Released: 11/11/2008 Document Revised: 12/01/2015 Document Reviewed: 10/20/2014 Elsevier Interactive Patient Education  2018 [employer].   IF you received an x-ray today, you will receive an invoice from [facility]. Please contact [facility] at [phone] with questions or concerns regarding your invoice.   IF you received labwork today, you will receive an invoice from [place]. Please contact [facility] at [phone] with questions or concerns regarding your invoice.   Our billing staff will not be able to assist you with questions regarding bills from these companies.  You will be contacted with the lab results as soon as they are available. The fastest way to get your results is to activate your My Chart account. Instructions are located on the last page of this paperwork. If you have not heard from [place] regarding the results in 2 weeks, please contact this office.

## 2017-11-22 ENCOUNTER — Encounter: Payer: Self-pay | Admitting: Family Medicine

## 2017-11-22 ENCOUNTER — Ambulatory Visit: Payer: BC Managed Care – PPO | Admitting: Family Medicine

## 2017-11-22 LAB — CK: Total CK: 64 U/L (ref 24–204)

## 2017-11-26 ENCOUNTER — Encounter: Payer: Self-pay | Admitting: *Deleted

## 2017-11-27 ENCOUNTER — Encounter: Payer: Self-pay | Admitting: Family Medicine

## 2017-12-05 ENCOUNTER — Ambulatory Visit: Payer: BC Managed Care – PPO | Admitting: Family Medicine

## 2019-06-14 IMAGING — DX DG CHEST 2V
3 series · 3 of 3 positions shown · non-contrast
Comparison: 06/03/2011

CLINICAL DATA: Cough, heartburn

EXAM:
CHEST - 2 VIEW

[chest pa (1 of 2)]
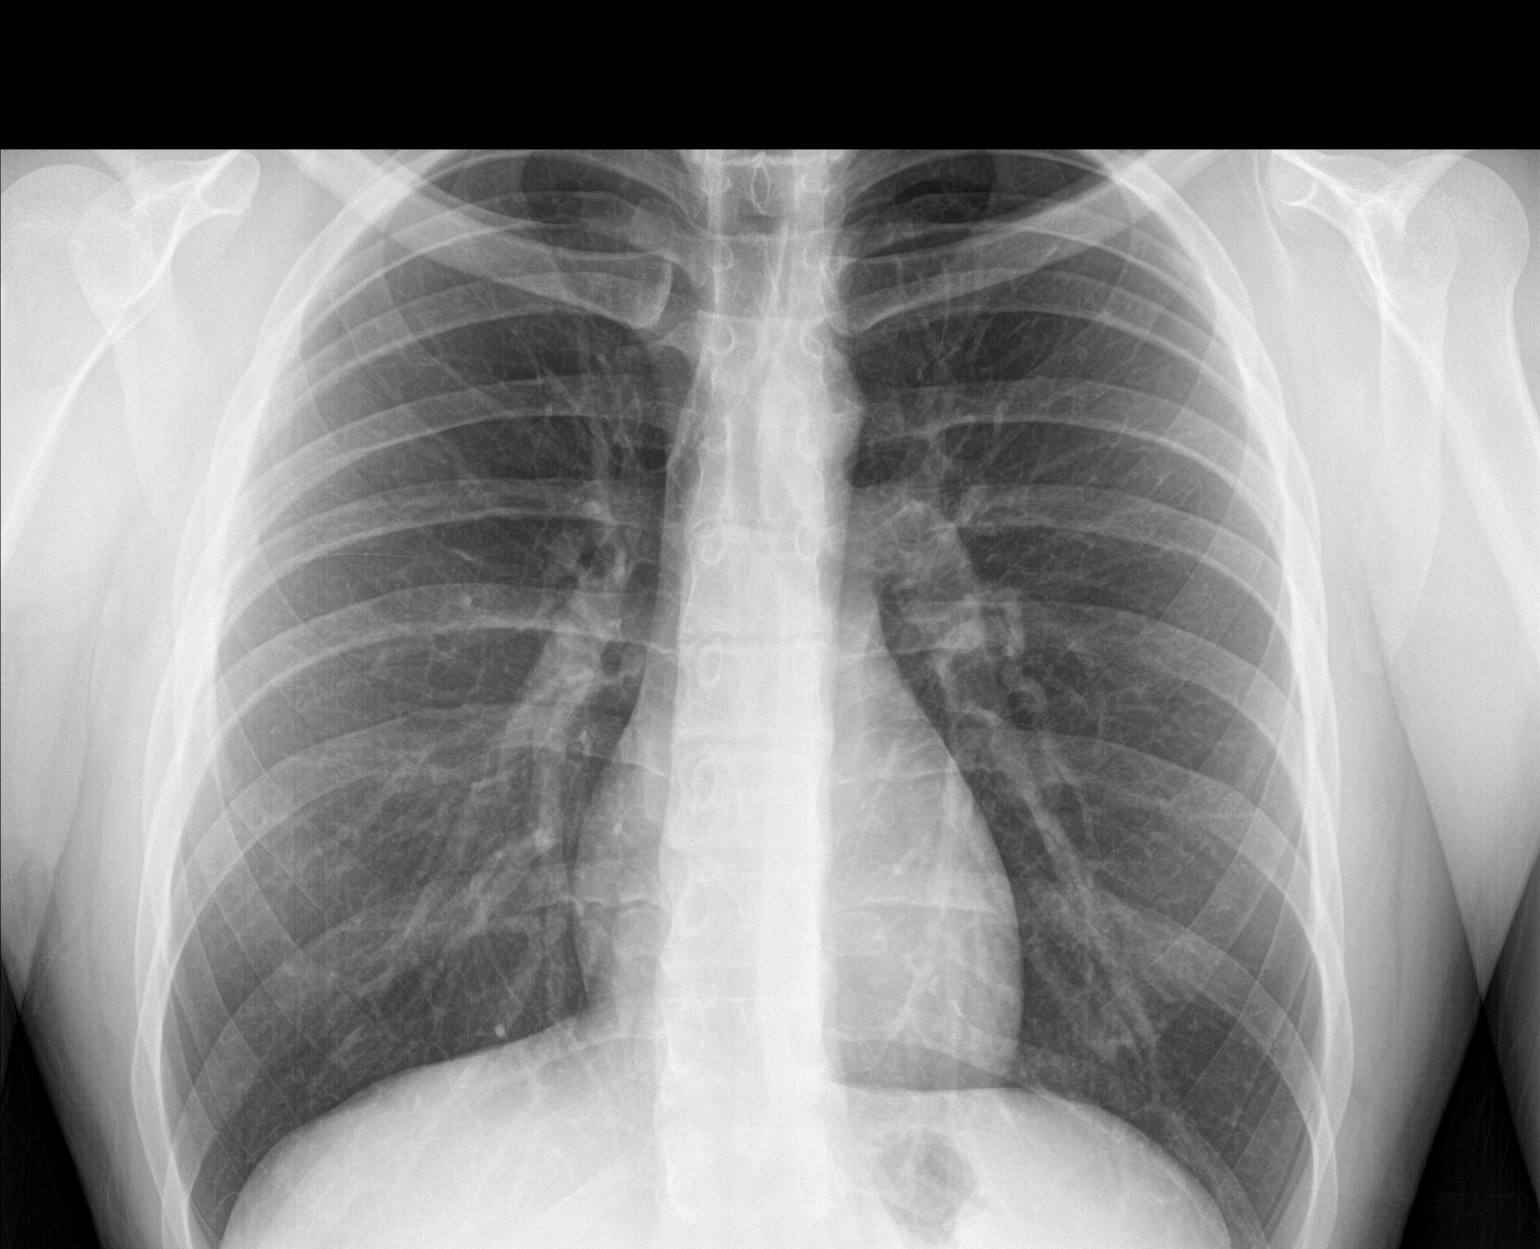

[chest lat]
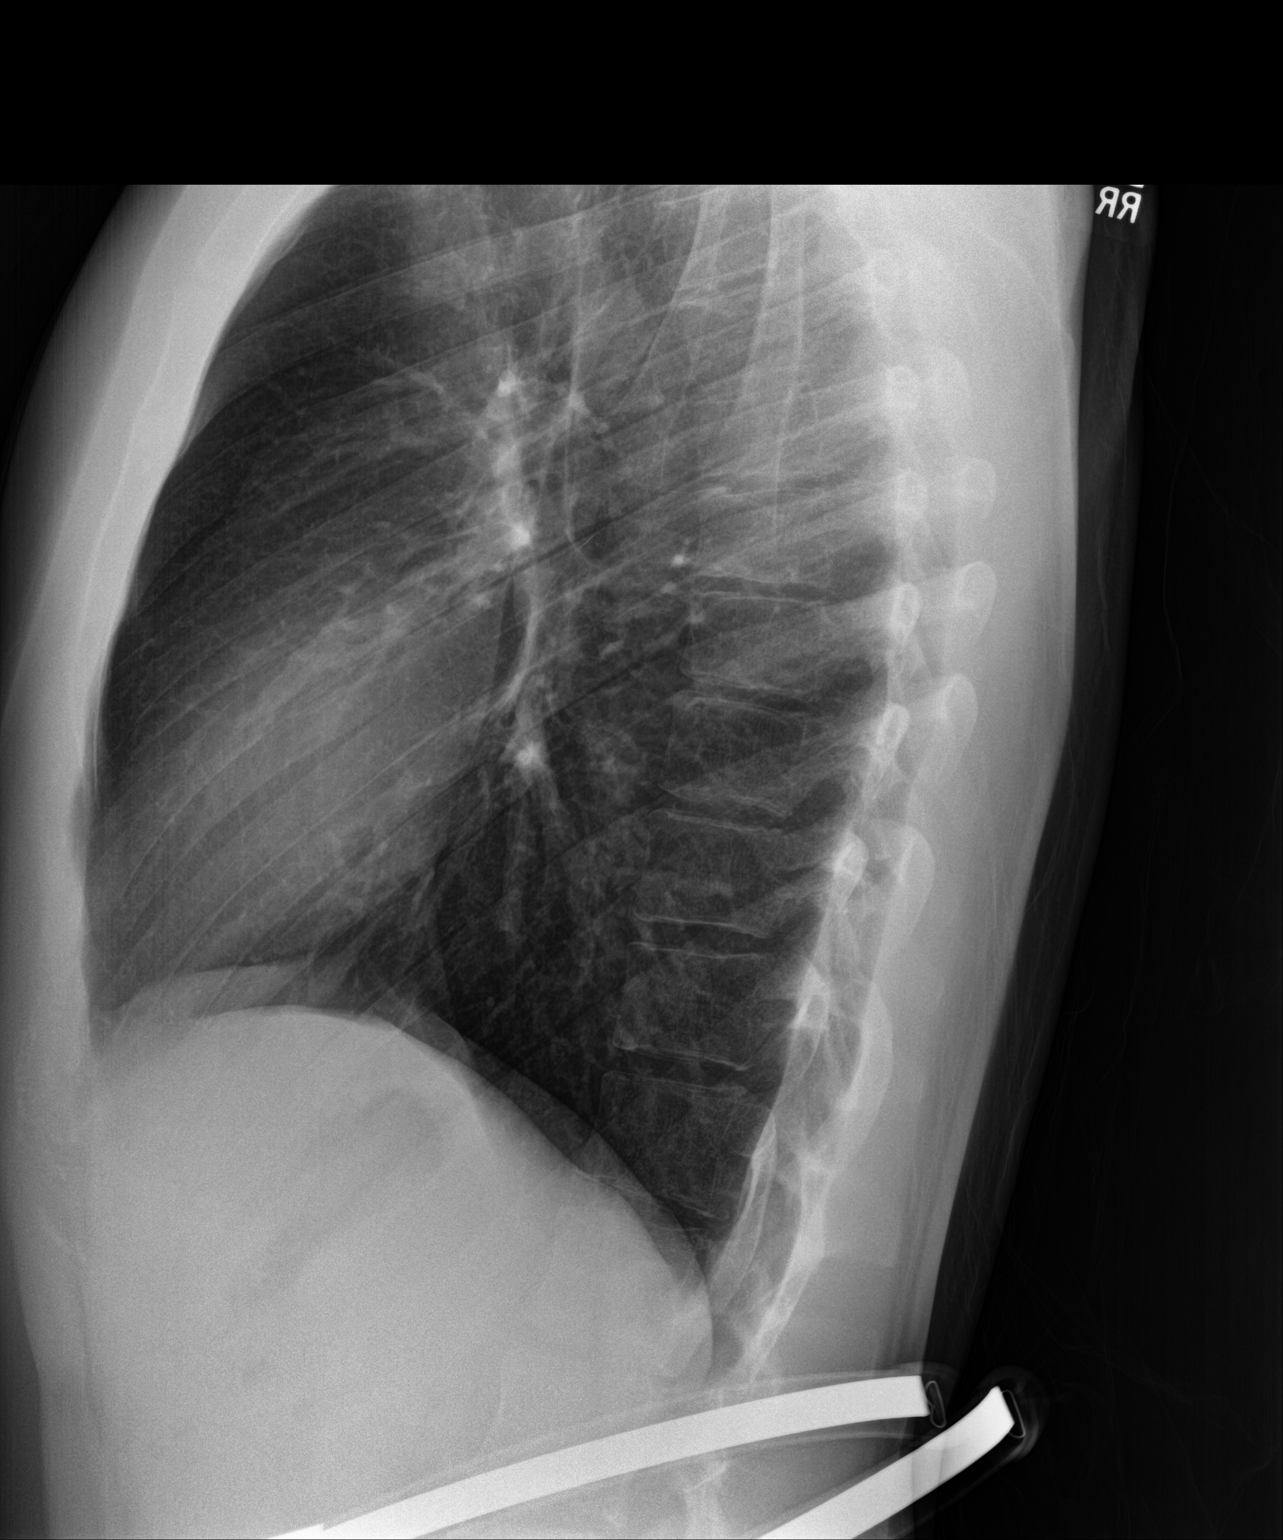

[chest pa (2 of 2)]
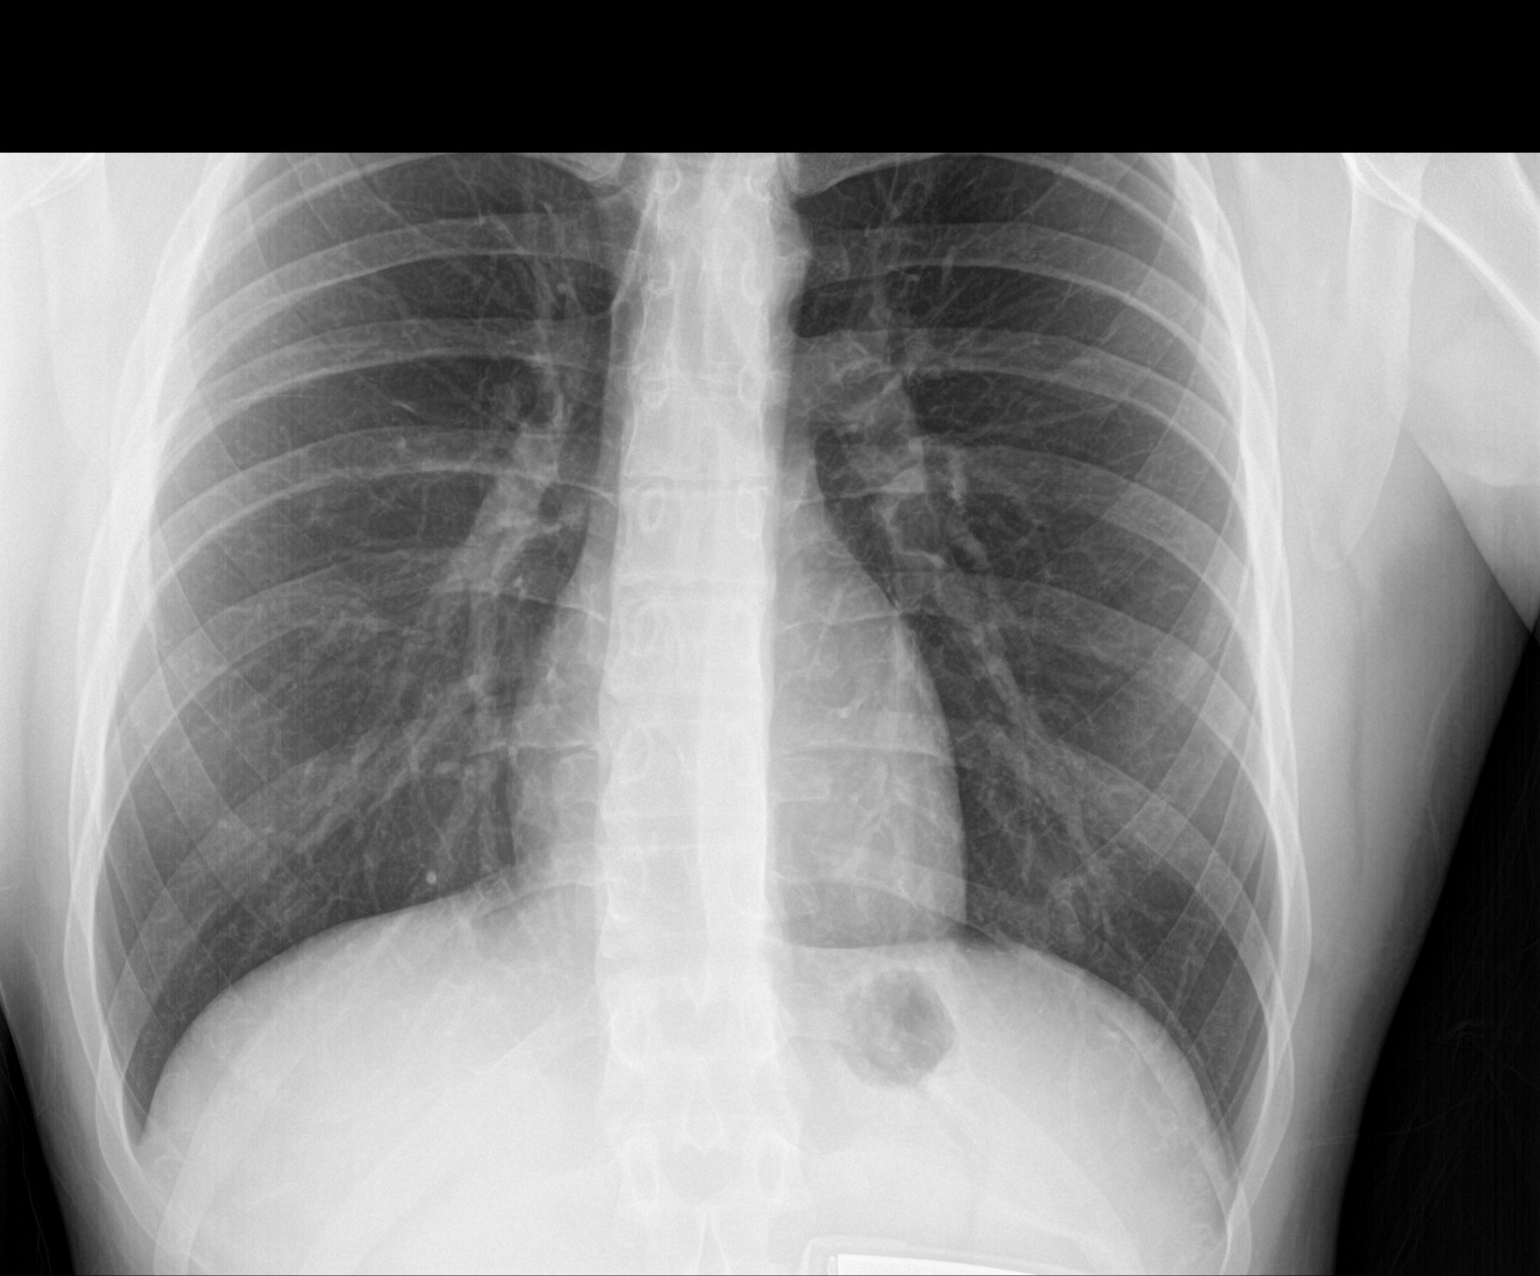

[3 of 3 positions shown; findings below may reference images not displayed]

FINDINGS: Heart and mediastinal contours are within normal limits. No focal
opacities or effusions. No acute bony abnormality.
IMPRESSION: No active cardiopulmonary disease.

## 2020-02-29 ENCOUNTER — Other Ambulatory Visit: Payer: Self-pay

## 2020-02-29 ENCOUNTER — Ambulatory Visit (HOSPITAL_COMMUNITY)
Admission: RE | Admit: 2020-02-29 | Discharge: 2020-02-29 | Disposition: A | Discharge status: Home / Self Care | Payer: BC Managed Care – PPO | Source: Ambulatory Visit | Attending: Family Medicine | Admitting: Family Medicine

## 2020-02-29 VITALS — BP 125/77 | HR 88 | Temp 98.5°F | Resp 18

## 2020-02-29 DIAGNOSIS — L03114 Cellulitis of left upper limb: Secondary | ICD-10-CM | POA: Diagnosis not present

## 2020-02-29 MED ORDER — MUPIROCIN 2 % EX OINT: TOPICAL_OINTMENT | CUTANEOUS | 0 refills | Status: AC

## 2020-02-29 MED ORDER — SULFAMETHOXAZOLE-TRIMETHOPRIM 800-160 MG PO TABS: 1.0000 | ORAL_TABLET | Freq: Two times a day (BID) | ORAL | 0 refills | Status: AC

## 2020-02-29 NOTE — ED Triage Notes (Signed)
New tattoo to left upper arm, concerned it is infected

## 2020-02-29 NOTE — ED Provider Notes (Signed)
[facility]    CSN: [id number] Arrival date & time: 02/29/20  1058      History   Chief Complaint Chief Complaint  Patient presents with  . Wound Check    HPI Donald Forbes is a 24 y.o. male.   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 He has a new tattoo on his left bicep and deltoid region, upper arm Patient noticed increased pain, redness, and swelling of the area and has a concern for infection No fever chills, headache body aches, nausea or vomiting Patient is otherwise in good health.  Past Medical History:  Diagnosis Date  . Allergy   . Arthritis     Patient Active Problem List   Diagnosis Date Noted  . Other seasonal allergic rhinitis 09/21/2014    Past Surgical History:  Procedure Laterality Date  . APPENDECTOMY         Home Medications    Prior to Admission medications   Medication Sig Start Date End Date Taking? Authorizing Provider  mupirocin ointment (BACTROBAN) 2 % Apply to rash two times a day 02/29/20   Eustace Moore, MD  sulfamethoxazole-trimethoprim (BACTRIM DS) 800-160 MG tablet Take 1 tablet by mouth 2 (two) times daily for 7 days. 02/29/20 03/07/20  Eustace Moore, MD    Family History Family History  Problem Relation Age of Onset  . Arthritis Mother   . Arthritis Maternal Grandmother   . Stroke Maternal Grandmother   . Arthritis Maternal Grandfather   . Stroke Maternal Grandfather   . Hyperlipidemia Maternal Grandfather   . Arthritis Paternal Grandmother   . Arthritis Paternal Grandfather   . Diabetes Paternal Grandfather   . Amenorrhea Maternal Aunt   . AAA (abdominal aortic aneurysm) Neg Hx     Social History Social History   Tobacco Use  . Smoking status: Never Smoker  . Smokeless tobacco: Current User    Types: Snuff  Vaping Use  . Vaping Use: Never used  Substance Use Topics  . Alcohol use: Yes    Comment: occ  . Drug use: No     Allergies   Benadryl [diphenhydramine]  and Codeine   Review of Systems Review of Systems See HPI Physical Exam Triage Vital Signs ED Triage Vitals  Enc Vitals Group     BP 02/29/20 1143 125/77     Pulse Rate 02/29/20 1142 88     Resp 02/29/20 1142 18     Temp 02/29/20 1142 98.5 F (36.9 C)     Temp src --      SpO2 02/29/20 1142 100 %     Weight --      Height --      Head Circumference --      Peak Flow --      Pain Score 02/29/20 1144 0     Pain Loc --      Pain Edu? --      Excl. in GC? --    No data found.  Updated Vital Signs BP 125/77   Pulse 88   Temp 98.5 F (36.9 C)   Resp 18   SpO2 100%   Visual Acuity Right Eye Distance:   Left Eye Distance:   Bilateral Distance:    Right Eye Near:   Left Eye Near:    Bilateral Near:     Physical Exam Constitutional:      General: He is not in acute distress.    Appearance: Normal appearance.  He is well-developed and normal weight.  HENT:     Head: Normocephalic and atraumatic.  Eyes:     Conjunctiva/sclera: Conjunctivae normal.     Pupils: Pupils are equal, round, and reactive to light.  Cardiovascular:     Rate and Rhythm: Normal rate.  Pulmonary:     Effort: Pulmonary effort is normal. No respiratory distress.  Abdominal:     General: There is no distension.     Palpations: Abdomen is soft.  Musculoskeletal:        General: Normal range of motion.     Cervical back: Normal range of motion.  Skin:    General: Skin is warm and dry.     Comments: Elaborate tattoo on left upper arm.  The surrounding skin has soft tissue swelling erythema and tenderness.  No discharge  Neurological:     Mental Status: He is alert.  Psychiatric:        Behavior: Behavior normal.      UC Treatments / Results  Labs (all labs ordered are listed, but only abnormal results are displayed) Labs Reviewed - No data to display  EKG   Radiology No results found.  Procedures Procedures (including critical care time)  Medications Ordered in  UC Medications - No data to display  Initial Impression / Assessment and Plan / UC Course  I have reviewed the triage vital signs and the nursing notes.  Pertinent labs & imaging results that were available during my care of the patient were reviewed by me and considered in my medical decision making (see chart for details).     I believe the patient is developing a superficial cellulitis.  We will treat with antibiotics mupirocin.  Return as needed Final Clinical Impressions(s) / UC Diagnoses   Final diagnoses:  Cellulitis of left upper extremity     Discharge Instructions     Take the antibiotic 2 times a day Take 2 doses today Wash gently and apply ointment 2 times a day Return if not improving over the next couple of days   ED Prescriptions    Medication Sig Dispense Auth. Provider   sulfamethoxazole-trimethoprim (BACTRIM DS) 800-160 MG tablet Take 1 tablet by mouth 2 (two) times daily for 7 days. 14 tablet Eustace Moore, MD   mupirocin ointment (BACTROBAN) 2 % Apply to rash two times a day 22 g Eustace Moore, MD     PDMP not reviewed this encounter.   Eustace Moore, MD 02/29/20 1325

## 2020-02-29 NOTE — Discharge Instructions (Signed)
Take the antibiotic 2 times a day Take 2 doses today Wash gently and apply ointment 2 times a day Return if not improving over the next couple of days

## 2021-09-20 DIAGNOSIS — M79671 Pain in right foot: Secondary | ICD-10-CM | POA: Diagnosis not present
